# Patient Record
Sex: Female | Born: 1950 | Race: White | Hispanic: No | Smoking: Former smoker
Health system: Southern US, Community
[De-identification: ages and names within clinical notes are randomized; demographics above are authoritative.]

## PROBLEM LIST (undated history)

## (undated) DIAGNOSIS — G500 Trigeminal neuralgia: Secondary | ICD-10-CM

## (undated) DIAGNOSIS — K5792 Diverticulitis of intestine, part unspecified, without perforation or abscess without bleeding: Secondary | ICD-10-CM

## (undated) HISTORY — PX: CHOLECYSTECTOMY: SHX55

## (undated) HISTORY — PX: TONSILLECTOMY AND ADENOIDECTOMY: SUR1326

## (undated) HISTORY — PX: COLONOSCOPY W/ POLYPECTOMY: SHX1380

## (undated) HISTORY — DX: Trigeminal neuralgia: G50.0

## (undated) HISTORY — PX: NASAL SINUS SURGERY: SHX719

## (undated) HISTORY — PX: NASAL SEPTUM SURGERY: SHX37

## (undated) HISTORY — DX: Diverticulitis of intestine, part unspecified, without perforation or abscess without bleeding: K57.92

## (undated) HISTORY — PX: BUNIONECTOMY: SHX129

## (undated) HISTORY — PX: NEPHRECTOMY RADICAL: SUR878

---

## 1998-03-08 ENCOUNTER — Ambulatory Visit (HOSPITAL_COMMUNITY): Admission: RE | Admit: 1998-03-08 | Discharge: 1998-03-08 | Payer: Self-pay | Admitting: Gastroenterology

## 1998-12-15 ENCOUNTER — Other Ambulatory Visit: Admission: RE | Admit: 1998-12-15 | Discharge: 1998-12-15 | Payer: Self-pay | Admitting: Obstetrics and Gynecology

## 2015-04-20 DIAGNOSIS — R1011 Right upper quadrant pain: Secondary | ICD-10-CM | POA: Diagnosis not present

## 2015-04-26 DIAGNOSIS — R110 Nausea: Secondary | ICD-10-CM | POA: Diagnosis not present

## 2015-04-26 DIAGNOSIS — R1013 Epigastric pain: Secondary | ICD-10-CM | POA: Diagnosis not present

## 2015-04-26 DIAGNOSIS — K760 Fatty (change of) liver, not elsewhere classified: Secondary | ICD-10-CM | POA: Diagnosis not present

## 2015-08-22 DIAGNOSIS — I200 Unstable angina: Secondary | ICD-10-CM | POA: Diagnosis not present

## 2015-08-22 DIAGNOSIS — E7800 Pure hypercholesterolemia, unspecified: Secondary | ICD-10-CM | POA: Diagnosis not present

## 2015-08-22 DIAGNOSIS — Z716 Tobacco abuse counseling: Secondary | ICD-10-CM | POA: Diagnosis not present

## 2015-08-22 DIAGNOSIS — Z23 Encounter for immunization: Secondary | ICD-10-CM | POA: Diagnosis not present

## 2015-08-22 DIAGNOSIS — R079 Chest pain, unspecified: Secondary | ICD-10-CM | POA: Diagnosis not present

## 2015-08-22 DIAGNOSIS — I10 Essential (primary) hypertension: Secondary | ICD-10-CM | POA: Diagnosis not present

## 2015-08-22 DIAGNOSIS — R0602 Shortness of breath: Secondary | ICD-10-CM | POA: Diagnosis not present

## 2015-08-22 DIAGNOSIS — R0789 Other chest pain: Secondary | ICD-10-CM | POA: Diagnosis not present

## 2015-08-22 DIAGNOSIS — E785 Hyperlipidemia, unspecified: Secondary | ICD-10-CM | POA: Diagnosis not present

## 2015-08-22 DIAGNOSIS — F17210 Nicotine dependence, cigarettes, uncomplicated: Secondary | ICD-10-CM | POA: Diagnosis not present

## 2015-08-22 DIAGNOSIS — Z8249 Family history of ischemic heart disease and other diseases of the circulatory system: Secondary | ICD-10-CM | POA: Diagnosis not present

## 2015-08-22 DIAGNOSIS — Z720 Tobacco use: Secondary | ICD-10-CM | POA: Diagnosis not present

## 2015-08-23 DIAGNOSIS — I10 Essential (primary) hypertension: Secondary | ICD-10-CM | POA: Diagnosis not present

## 2015-08-23 DIAGNOSIS — R079 Chest pain, unspecified: Secondary | ICD-10-CM | POA: Diagnosis not present

## 2015-08-23 DIAGNOSIS — Z720 Tobacco use: Secondary | ICD-10-CM | POA: Diagnosis not present

## 2015-08-23 DIAGNOSIS — R0789 Other chest pain: Secondary | ICD-10-CM | POA: Diagnosis not present

## 2015-08-30 DIAGNOSIS — I10 Essential (primary) hypertension: Secondary | ICD-10-CM | POA: Diagnosis not present

## 2015-08-30 DIAGNOSIS — F4320 Adjustment disorder, unspecified: Secondary | ICD-10-CM | POA: Diagnosis not present

## 2015-12-22 DIAGNOSIS — J209 Acute bronchitis, unspecified: Secondary | ICD-10-CM | POA: Diagnosis not present

## 2016-05-08 DIAGNOSIS — F17200 Nicotine dependence, unspecified, uncomplicated: Secondary | ICD-10-CM | POA: Diagnosis not present

## 2016-05-08 DIAGNOSIS — K2970 Gastritis, unspecified, without bleeding: Secondary | ICD-10-CM | POA: Diagnosis not present

## 2016-05-08 DIAGNOSIS — D539 Nutritional anemia, unspecified: Secondary | ICD-10-CM | POA: Diagnosis not present

## 2016-07-17 DIAGNOSIS — H52223 Regular astigmatism, bilateral: Secondary | ICD-10-CM | POA: Diagnosis not present

## 2016-07-17 DIAGNOSIS — H43813 Vitreous degeneration, bilateral: Secondary | ICD-10-CM | POA: Diagnosis not present

## 2016-07-17 DIAGNOSIS — H5203 Hypermetropia, bilateral: Secondary | ICD-10-CM | POA: Diagnosis not present

## 2016-07-17 DIAGNOSIS — H43393 Other vitreous opacities, bilateral: Secondary | ICD-10-CM | POA: Diagnosis not present

## 2016-07-17 DIAGNOSIS — H524 Presbyopia: Secondary | ICD-10-CM | POA: Diagnosis not present

## 2016-07-17 DIAGNOSIS — I10 Essential (primary) hypertension: Secondary | ICD-10-CM | POA: Diagnosis not present

## 2016-08-02 DIAGNOSIS — I10 Essential (primary) hypertension: Secondary | ICD-10-CM | POA: Diagnosis not present

## 2016-08-02 DIAGNOSIS — Z23 Encounter for immunization: Secondary | ICD-10-CM | POA: Diagnosis not present

## 2016-08-02 DIAGNOSIS — R51 Headache: Secondary | ICD-10-CM | POA: Diagnosis not present

## 2016-08-02 DIAGNOSIS — H81399 Other peripheral vertigo, unspecified ear: Secondary | ICD-10-CM | POA: Diagnosis not present

## 2017-01-22 DIAGNOSIS — R112 Nausea with vomiting, unspecified: Secondary | ICD-10-CM | POA: Diagnosis not present

## 2017-01-22 DIAGNOSIS — I10 Essential (primary) hypertension: Secondary | ICD-10-CM | POA: Diagnosis not present

## 2017-01-22 DIAGNOSIS — R51 Headache: Secondary | ICD-10-CM | POA: Diagnosis not present

## 2017-01-22 DIAGNOSIS — R42 Dizziness and giddiness: Secondary | ICD-10-CM | POA: Diagnosis not present

## 2017-02-10 DIAGNOSIS — M791 Myalgia: Secondary | ICD-10-CM | POA: Diagnosis not present

## 2017-02-10 DIAGNOSIS — M549 Dorsalgia, unspecified: Secondary | ICD-10-CM | POA: Diagnosis not present

## 2017-02-10 DIAGNOSIS — M542 Cervicalgia: Secondary | ICD-10-CM | POA: Diagnosis not present

## 2017-02-11 DIAGNOSIS — M542 Cervicalgia: Secondary | ICD-10-CM | POA: Diagnosis not present

## 2017-02-19 DIAGNOSIS — F17210 Nicotine dependence, cigarettes, uncomplicated: Secondary | ICD-10-CM | POA: Diagnosis not present

## 2017-02-19 DIAGNOSIS — D519 Vitamin B12 deficiency anemia, unspecified: Secondary | ICD-10-CM | POA: Diagnosis not present

## 2017-02-19 DIAGNOSIS — Z79899 Other long term (current) drug therapy: Secondary | ICD-10-CM | POA: Diagnosis not present

## 2017-02-19 DIAGNOSIS — I10 Essential (primary) hypertension: Secondary | ICD-10-CM | POA: Diagnosis not present

## 2017-02-19 DIAGNOSIS — R5383 Other fatigue: Secondary | ICD-10-CM | POA: Diagnosis not present

## 2017-02-19 DIAGNOSIS — Z13220 Encounter for screening for lipoid disorders: Secondary | ICD-10-CM | POA: Diagnosis not present

## 2017-02-19 DIAGNOSIS — Z131 Encounter for screening for diabetes mellitus: Secondary | ICD-10-CM | POA: Diagnosis not present

## 2017-02-19 DIAGNOSIS — Z6835 Body mass index (BMI) 35.0-35.9, adult: Secondary | ICD-10-CM | POA: Diagnosis not present

## 2017-02-19 DIAGNOSIS — E559 Vitamin D deficiency, unspecified: Secondary | ICD-10-CM | POA: Diagnosis not present

## 2017-03-06 DIAGNOSIS — E782 Mixed hyperlipidemia: Secondary | ICD-10-CM | POA: Diagnosis not present

## 2017-03-06 DIAGNOSIS — Z79899 Other long term (current) drug therapy: Secondary | ICD-10-CM | POA: Diagnosis not present

## 2017-03-30 DIAGNOSIS — S81831A Puncture wound without foreign body, right lower leg, initial encounter: Secondary | ICD-10-CM | POA: Diagnosis not present

## 2017-03-30 DIAGNOSIS — R062 Wheezing: Secondary | ICD-10-CM | POA: Diagnosis not present

## 2017-04-02 DIAGNOSIS — S134XXA Sprain of ligaments of cervical spine, initial encounter: Secondary | ICD-10-CM | POA: Diagnosis not present

## 2017-04-02 DIAGNOSIS — R05 Cough: Secondary | ICD-10-CM | POA: Diagnosis not present

## 2017-04-02 DIAGNOSIS — J40 Bronchitis, not specified as acute or chronic: Secondary | ICD-10-CM | POA: Diagnosis not present

## 2017-04-30 DIAGNOSIS — Z87828 Personal history of other (healed) physical injury and trauma: Secondary | ICD-10-CM | POA: Diagnosis not present

## 2017-04-30 DIAGNOSIS — M62830 Muscle spasm of back: Secondary | ICD-10-CM | POA: Diagnosis not present

## 2017-05-20 DIAGNOSIS — R079 Chest pain, unspecified: Secondary | ICD-10-CM | POA: Diagnosis not present

## 2017-05-20 DIAGNOSIS — R0602 Shortness of breath: Secondary | ICD-10-CM | POA: Diagnosis not present

## 2017-05-20 DIAGNOSIS — D519 Vitamin B12 deficiency anemia, unspecified: Secondary | ICD-10-CM | POA: Diagnosis not present

## 2017-05-20 DIAGNOSIS — E782 Mixed hyperlipidemia: Secondary | ICD-10-CM | POA: Diagnosis not present

## 2017-05-20 DIAGNOSIS — I10 Essential (primary) hypertension: Secondary | ICD-10-CM | POA: Diagnosis not present

## 2017-05-20 DIAGNOSIS — E559 Vitamin D deficiency, unspecified: Secondary | ICD-10-CM | POA: Diagnosis not present

## 2017-05-20 DIAGNOSIS — Z79899 Other long term (current) drug therapy: Secondary | ICD-10-CM | POA: Diagnosis not present

## 2017-05-28 DIAGNOSIS — M62830 Muscle spasm of back: Secondary | ICD-10-CM | POA: Diagnosis not present

## 2017-05-28 DIAGNOSIS — Z87828 Personal history of other (healed) physical injury and trauma: Secondary | ICD-10-CM | POA: Diagnosis not present

## 2017-07-22 DIAGNOSIS — M5412 Radiculopathy, cervical region: Secondary | ICD-10-CM | POA: Diagnosis not present

## 2017-07-22 DIAGNOSIS — M542 Cervicalgia: Secondary | ICD-10-CM | POA: Diagnosis not present

## 2017-07-22 DIAGNOSIS — M545 Low back pain: Secondary | ICD-10-CM | POA: Diagnosis not present

## 2017-07-23 DIAGNOSIS — Z23 Encounter for immunization: Secondary | ICD-10-CM | POA: Diagnosis not present

## 2017-08-22 DIAGNOSIS — M542 Cervicalgia: Secondary | ICD-10-CM | POA: Diagnosis not present

## 2017-08-27 DIAGNOSIS — E559 Vitamin D deficiency, unspecified: Secondary | ICD-10-CM | POA: Diagnosis not present

## 2017-09-02 DIAGNOSIS — M542 Cervicalgia: Secondary | ICD-10-CM | POA: Diagnosis not present

## 2017-09-02 DIAGNOSIS — M545 Low back pain: Secondary | ICD-10-CM | POA: Diagnosis not present

## 2017-09-03 DIAGNOSIS — M47812 Spondylosis without myelopathy or radiculopathy, cervical region: Secondary | ICD-10-CM | POA: Diagnosis not present

## 2017-09-19 DIAGNOSIS — M47812 Spondylosis without myelopathy or radiculopathy, cervical region: Secondary | ICD-10-CM | POA: Diagnosis not present

## 2017-11-20 DIAGNOSIS — F321 Major depressive disorder, single episode, moderate: Secondary | ICD-10-CM | POA: Diagnosis not present

## 2017-11-20 DIAGNOSIS — E782 Mixed hyperlipidemia: Secondary | ICD-10-CM | POA: Diagnosis not present

## 2017-11-20 DIAGNOSIS — I10 Essential (primary) hypertension: Secondary | ICD-10-CM | POA: Diagnosis not present

## 2017-11-20 DIAGNOSIS — Z79899 Other long term (current) drug therapy: Secondary | ICD-10-CM | POA: Diagnosis not present

## 2017-11-20 DIAGNOSIS — E559 Vitamin D deficiency, unspecified: Secondary | ICD-10-CM | POA: Diagnosis not present

## 2017-11-20 DIAGNOSIS — R0602 Shortness of breath: Secondary | ICD-10-CM | POA: Diagnosis not present

## 2017-11-20 DIAGNOSIS — N3281 Overactive bladder: Secondary | ICD-10-CM | POA: Diagnosis not present

## 2017-12-24 DIAGNOSIS — M47812 Spondylosis without myelopathy or radiculopathy, cervical region: Secondary | ICD-10-CM | POA: Diagnosis not present

## 2018-01-01 DIAGNOSIS — F321 Major depressive disorder, single episode, moderate: Secondary | ICD-10-CM | POA: Diagnosis not present

## 2018-01-01 DIAGNOSIS — R5383 Other fatigue: Secondary | ICD-10-CM | POA: Diagnosis not present

## 2018-01-06 DIAGNOSIS — M791 Myalgia, unspecified site: Secondary | ICD-10-CM | POA: Diagnosis not present

## 2018-01-06 DIAGNOSIS — M542 Cervicalgia: Secondary | ICD-10-CM | POA: Diagnosis not present

## 2018-01-06 DIAGNOSIS — M545 Low back pain: Secondary | ICD-10-CM | POA: Diagnosis not present

## 2018-01-13 DIAGNOSIS — M791 Myalgia, unspecified site: Secondary | ICD-10-CM | POA: Diagnosis not present

## 2018-01-13 DIAGNOSIS — M542 Cervicalgia: Secondary | ICD-10-CM | POA: Diagnosis not present

## 2018-01-13 DIAGNOSIS — M545 Low back pain: Secondary | ICD-10-CM | POA: Diagnosis not present

## 2018-01-15 DIAGNOSIS — M542 Cervicalgia: Secondary | ICD-10-CM | POA: Diagnosis not present

## 2018-01-15 DIAGNOSIS — M791 Myalgia, unspecified site: Secondary | ICD-10-CM | POA: Diagnosis not present

## 2018-01-15 DIAGNOSIS — M545 Low back pain: Secondary | ICD-10-CM | POA: Diagnosis not present

## 2018-01-27 DIAGNOSIS — M542 Cervicalgia: Secondary | ICD-10-CM | POA: Diagnosis not present

## 2018-01-27 DIAGNOSIS — M545 Low back pain: Secondary | ICD-10-CM | POA: Diagnosis not present

## 2018-01-27 DIAGNOSIS — M791 Myalgia, unspecified site: Secondary | ICD-10-CM | POA: Diagnosis not present

## 2018-01-30 DIAGNOSIS — M791 Myalgia, unspecified site: Secondary | ICD-10-CM | POA: Diagnosis not present

## 2018-01-30 DIAGNOSIS — M542 Cervicalgia: Secondary | ICD-10-CM | POA: Diagnosis not present

## 2018-01-30 DIAGNOSIS — M545 Low back pain: Secondary | ICD-10-CM | POA: Diagnosis not present

## 2018-02-05 DIAGNOSIS — N2889 Other specified disorders of kidney and ureter: Secondary | ICD-10-CM | POA: Diagnosis not present

## 2018-02-05 DIAGNOSIS — R935 Abnormal findings on diagnostic imaging of other abdominal regions, including retroperitoneum: Secondary | ICD-10-CM | POA: Diagnosis not present

## 2018-02-05 DIAGNOSIS — I700 Atherosclerosis of aorta: Secondary | ICD-10-CM | POA: Diagnosis not present

## 2018-02-05 DIAGNOSIS — K5792 Diverticulitis of intestine, part unspecified, without perforation or abscess without bleeding: Secondary | ICD-10-CM | POA: Diagnosis not present

## 2018-02-05 DIAGNOSIS — K5732 Diverticulitis of large intestine without perforation or abscess without bleeding: Secondary | ICD-10-CM | POA: Diagnosis not present

## 2018-02-05 DIAGNOSIS — R1030 Lower abdominal pain, unspecified: Secondary | ICD-10-CM | POA: Diagnosis not present

## 2018-02-10 DIAGNOSIS — K5792 Diverticulitis of intestine, part unspecified, without perforation or abscess without bleeding: Secondary | ICD-10-CM | POA: Diagnosis not present

## 2018-02-19 DIAGNOSIS — N2889 Other specified disorders of kidney and ureter: Secondary | ICD-10-CM | POA: Diagnosis not present

## 2018-03-09 DIAGNOSIS — H6123 Impacted cerumen, bilateral: Secondary | ICD-10-CM | POA: Diagnosis not present

## 2018-03-09 DIAGNOSIS — J069 Acute upper respiratory infection, unspecified: Secondary | ICD-10-CM | POA: Diagnosis not present

## 2018-03-13 DIAGNOSIS — R062 Wheezing: Secondary | ICD-10-CM | POA: Diagnosis not present

## 2018-03-13 DIAGNOSIS — J189 Pneumonia, unspecified organism: Secondary | ICD-10-CM | POA: Diagnosis not present

## 2018-03-13 DIAGNOSIS — R05 Cough: Secondary | ICD-10-CM | POA: Diagnosis not present

## 2018-03-13 DIAGNOSIS — R0602 Shortness of breath: Secondary | ICD-10-CM | POA: Diagnosis not present

## 2018-03-13 DIAGNOSIS — S134XXA Sprain of ligaments of cervical spine, initial encounter: Secondary | ICD-10-CM | POA: Diagnosis not present

## 2018-04-02 DIAGNOSIS — N2889 Other specified disorders of kidney and ureter: Secondary | ICD-10-CM | POA: Diagnosis not present

## 2018-04-02 DIAGNOSIS — Z87891 Personal history of nicotine dependence: Secondary | ICD-10-CM | POA: Diagnosis not present

## 2018-04-02 DIAGNOSIS — R918 Other nonspecific abnormal finding of lung field: Secondary | ICD-10-CM | POA: Diagnosis not present

## 2018-04-28 DIAGNOSIS — N2889 Other specified disorders of kidney and ureter: Secondary | ICD-10-CM | POA: Diagnosis not present

## 2018-05-15 DIAGNOSIS — E119 Type 2 diabetes mellitus without complications: Secondary | ICD-10-CM | POA: Diagnosis present

## 2018-05-15 DIAGNOSIS — E669 Obesity, unspecified: Secondary | ICD-10-CM | POA: Diagnosis present

## 2018-05-15 DIAGNOSIS — M899 Disorder of bone, unspecified: Secondary | ICD-10-CM | POA: Diagnosis present

## 2018-05-15 DIAGNOSIS — N281 Cyst of kidney, acquired: Secondary | ICD-10-CM | POA: Diagnosis present

## 2018-05-15 DIAGNOSIS — E785 Hyperlipidemia, unspecified: Secondary | ICD-10-CM | POA: Diagnosis present

## 2018-05-15 DIAGNOSIS — C641 Malignant neoplasm of right kidney, except renal pelvis: Secondary | ICD-10-CM | POA: Diagnosis not present

## 2018-05-15 DIAGNOSIS — I708 Atherosclerosis of other arteries: Secondary | ICD-10-CM | POA: Diagnosis present

## 2018-05-15 DIAGNOSIS — I10 Essential (primary) hypertension: Secondary | ICD-10-CM | POA: Diagnosis present

## 2018-05-15 DIAGNOSIS — Z6835 Body mass index (BMI) 35.0-35.9, adult: Secondary | ICD-10-CM | POA: Diagnosis not present

## 2018-05-15 DIAGNOSIS — R109 Unspecified abdominal pain: Secondary | ICD-10-CM | POA: Diagnosis not present

## 2018-05-15 DIAGNOSIS — N2889 Other specified disorders of kidney and ureter: Secondary | ICD-10-CM | POA: Diagnosis not present

## 2018-05-15 DIAGNOSIS — J449 Chronic obstructive pulmonary disease, unspecified: Secondary | ICD-10-CM | POA: Diagnosis not present

## 2018-05-15 DIAGNOSIS — F17210 Nicotine dependence, cigarettes, uncomplicated: Secondary | ICD-10-CM | POA: Diagnosis present

## 2018-05-15 DIAGNOSIS — K5730 Diverticulosis of large intestine without perforation or abscess without bleeding: Secondary | ICD-10-CM | POA: Diagnosis present

## 2018-06-05 DIAGNOSIS — R490 Dysphonia: Secondary | ICD-10-CM | POA: Diagnosis not present

## 2018-06-05 DIAGNOSIS — F17200 Nicotine dependence, unspecified, uncomplicated: Secondary | ICD-10-CM | POA: Diagnosis not present

## 2018-06-05 DIAGNOSIS — J343 Hypertrophy of nasal turbinates: Secondary | ICD-10-CM | POA: Diagnosis not present

## 2018-06-05 DIAGNOSIS — Z905 Acquired absence of kidney: Secondary | ICD-10-CM | POA: Diagnosis not present

## 2018-06-05 DIAGNOSIS — J3489 Other specified disorders of nose and nasal sinuses: Secondary | ICD-10-CM | POA: Diagnosis not present

## 2018-06-05 DIAGNOSIS — R0981 Nasal congestion: Secondary | ICD-10-CM | POA: Diagnosis not present

## 2018-06-05 DIAGNOSIS — T8579XA Infection and inflammatory reaction due to other internal prosthetic devices, implants and grafts, initial encounter: Secondary | ICD-10-CM | POA: Diagnosis not present

## 2018-06-05 DIAGNOSIS — C641 Malignant neoplasm of right kidney, except renal pelvis: Secondary | ICD-10-CM | POA: Diagnosis not present

## 2018-06-05 DIAGNOSIS — Z9889 Other specified postprocedural states: Secondary | ICD-10-CM | POA: Diagnosis not present

## 2018-06-05 DIAGNOSIS — J342 Deviated nasal septum: Secondary | ICD-10-CM | POA: Diagnosis not present

## 2018-06-18 DIAGNOSIS — C649 Malignant neoplasm of unspecified kidney, except renal pelvis: Secondary | ICD-10-CM | POA: Diagnosis not present

## 2018-08-04 DIAGNOSIS — Z23 Encounter for immunization: Secondary | ICD-10-CM | POA: Diagnosis not present

## 2018-09-08 DIAGNOSIS — N2889 Other specified disorders of kidney and ureter: Secondary | ICD-10-CM | POA: Diagnosis not present

## 2018-09-08 DIAGNOSIS — R0602 Shortness of breath: Secondary | ICD-10-CM | POA: Diagnosis not present

## 2018-09-08 DIAGNOSIS — N6459 Other signs and symptoms in breast: Secondary | ICD-10-CM | POA: Diagnosis not present

## 2018-09-08 DIAGNOSIS — I10 Essential (primary) hypertension: Secondary | ICD-10-CM | POA: Diagnosis not present

## 2018-09-08 DIAGNOSIS — Z6834 Body mass index (BMI) 34.0-34.9, adult: Secondary | ICD-10-CM | POA: Diagnosis not present

## 2018-09-08 DIAGNOSIS — K5792 Diverticulitis of intestine, part unspecified, without perforation or abscess without bleeding: Secondary | ICD-10-CM | POA: Diagnosis not present

## 2018-09-18 DIAGNOSIS — D72829 Elevated white blood cell count, unspecified: Secondary | ICD-10-CM | POA: Diagnosis not present

## 2018-10-02 DIAGNOSIS — K219 Gastro-esophageal reflux disease without esophagitis: Secondary | ICD-10-CM | POA: Diagnosis not present

## 2018-10-02 DIAGNOSIS — R1013 Epigastric pain: Secondary | ICD-10-CM | POA: Diagnosis not present

## 2018-10-02 DIAGNOSIS — R1011 Right upper quadrant pain: Secondary | ICD-10-CM | POA: Diagnosis not present

## 2018-10-17 DIAGNOSIS — I10 Essential (primary) hypertension: Secondary | ICD-10-CM | POA: Diagnosis not present

## 2018-10-17 DIAGNOSIS — K5730 Diverticulosis of large intestine without perforation or abscess without bleeding: Secondary | ICD-10-CM | POA: Diagnosis not present

## 2018-10-17 DIAGNOSIS — F17210 Nicotine dependence, cigarettes, uncomplicated: Secondary | ICD-10-CM | POA: Diagnosis not present

## 2018-10-17 DIAGNOSIS — D126 Benign neoplasm of colon, unspecified: Secondary | ICD-10-CM | POA: Diagnosis not present

## 2018-10-17 DIAGNOSIS — D123 Benign neoplasm of transverse colon: Secondary | ICD-10-CM | POA: Diagnosis not present

## 2018-10-17 DIAGNOSIS — D649 Anemia, unspecified: Secondary | ICD-10-CM | POA: Diagnosis not present

## 2018-10-17 DIAGNOSIS — Z8619 Personal history of other infectious and parasitic diseases: Secondary | ICD-10-CM | POA: Diagnosis not present

## 2018-10-17 DIAGNOSIS — Z85528 Personal history of other malignant neoplasm of kidney: Secondary | ICD-10-CM | POA: Diagnosis not present

## 2018-10-17 DIAGNOSIS — R109 Unspecified abdominal pain: Secondary | ICD-10-CM | POA: Diagnosis not present

## 2018-10-17 DIAGNOSIS — D120 Benign neoplasm of cecum: Secondary | ICD-10-CM | POA: Diagnosis not present

## 2018-10-17 DIAGNOSIS — J45909 Unspecified asthma, uncomplicated: Secondary | ICD-10-CM | POA: Diagnosis not present

## 2018-10-17 DIAGNOSIS — B9681 Helicobacter pylori [H. pylori] as the cause of diseases classified elsewhere: Secondary | ICD-10-CM | POA: Diagnosis not present

## 2018-10-17 DIAGNOSIS — D128 Benign neoplasm of rectum: Secondary | ICD-10-CM | POA: Diagnosis not present

## 2018-10-17 DIAGNOSIS — Z800 Family history of malignant neoplasm of digestive organs: Secondary | ICD-10-CM | POA: Diagnosis not present

## 2018-10-17 DIAGNOSIS — K621 Rectal polyp: Secondary | ICD-10-CM | POA: Diagnosis not present

## 2018-10-17 DIAGNOSIS — Z79899 Other long term (current) drug therapy: Secondary | ICD-10-CM | POA: Diagnosis not present

## 2018-10-17 DIAGNOSIS — Z1211 Encounter for screening for malignant neoplasm of colon: Secondary | ICD-10-CM | POA: Diagnosis not present

## 2018-10-17 DIAGNOSIS — D124 Benign neoplasm of descending colon: Secondary | ICD-10-CM | POA: Diagnosis not present

## 2018-10-17 DIAGNOSIS — K219 Gastro-esophageal reflux disease without esophagitis: Secondary | ICD-10-CM | POA: Diagnosis not present

## 2018-10-22 DIAGNOSIS — N6489 Other specified disorders of breast: Secondary | ICD-10-CM | POA: Diagnosis not present

## 2018-10-22 DIAGNOSIS — R928 Other abnormal and inconclusive findings on diagnostic imaging of breast: Secondary | ICD-10-CM | POA: Diagnosis not present

## 2018-10-22 DIAGNOSIS — N6459 Other signs and symptoms in breast: Secondary | ICD-10-CM | POA: Diagnosis not present

## 2018-12-02 DIAGNOSIS — B370 Candidal stomatitis: Secondary | ICD-10-CM | POA: Diagnosis not present

## 2018-12-02 DIAGNOSIS — J40 Bronchitis, not specified as acute or chronic: Secondary | ICD-10-CM | POA: Diagnosis not present

## 2018-12-02 DIAGNOSIS — Z6835 Body mass index (BMI) 35.0-35.9, adult: Secondary | ICD-10-CM | POA: Diagnosis not present

## 2018-12-02 DIAGNOSIS — E669 Obesity, unspecified: Secondary | ICD-10-CM | POA: Diagnosis not present

## 2018-12-17 DIAGNOSIS — N281 Cyst of kidney, acquired: Secondary | ICD-10-CM | POA: Diagnosis not present

## 2018-12-17 DIAGNOSIS — C641 Malignant neoplasm of right kidney, except renal pelvis: Secondary | ICD-10-CM | POA: Diagnosis not present

## 2018-12-17 DIAGNOSIS — C649 Malignant neoplasm of unspecified kidney, except renal pelvis: Secondary | ICD-10-CM | POA: Diagnosis not present

## 2018-12-19 ENCOUNTER — Other Ambulatory Visit: Payer: Self-pay | Admitting: Urology

## 2018-12-19 DIAGNOSIS — K828 Other specified diseases of gallbladder: Secondary | ICD-10-CM

## 2018-12-19 DIAGNOSIS — C641 Malignant neoplasm of right kidney, except renal pelvis: Secondary | ICD-10-CM

## 2018-12-24 ENCOUNTER — Inpatient Hospital Stay: Admission: RE | Admit: 2018-12-24 | Payer: Self-pay | Source: Ambulatory Visit

## 2018-12-25 ENCOUNTER — Ambulatory Visit
Admission: RE | Admit: 2018-12-25 | Discharge: 2018-12-25 | Disposition: A | Discharge status: Home / Self Care | Payer: Medicare Other | Source: Ambulatory Visit | Attending: Urology | Admitting: Urology

## 2018-12-25 ENCOUNTER — Ambulatory Visit
Admission: RE | Admit: 2018-12-25 | Discharge: 2018-12-25 | Disposition: A | Discharge status: Home / Self Care | Payer: Self-pay | Source: Ambulatory Visit | Attending: Urology | Admitting: Urology

## 2018-12-25 ENCOUNTER — Other Ambulatory Visit: Payer: Self-pay | Admitting: Urology

## 2018-12-25 DIAGNOSIS — K828 Other specified diseases of gallbladder: Secondary | ICD-10-CM

## 2018-12-25 DIAGNOSIS — C641 Malignant neoplasm of right kidney, except renal pelvis: Secondary | ICD-10-CM

## 2019-01-29 DIAGNOSIS — K8010 Calculus of gallbladder with chronic cholecystitis without obstruction: Secondary | ICD-10-CM

## 2019-01-29 HISTORY — DX: Calculus of gallbladder with chronic cholecystitis without obstruction: K80.10

## 2019-02-04 DIAGNOSIS — N189 Chronic kidney disease, unspecified: Secondary | ICD-10-CM | POA: Diagnosis not present

## 2019-02-04 DIAGNOSIS — F17210 Nicotine dependence, cigarettes, uncomplicated: Secondary | ICD-10-CM | POA: Diagnosis not present

## 2019-02-04 DIAGNOSIS — K8010 Calculus of gallbladder with chronic cholecystitis without obstruction: Secondary | ICD-10-CM | POA: Diagnosis not present

## 2019-02-04 DIAGNOSIS — Z01818 Encounter for other preprocedural examination: Secondary | ICD-10-CM | POA: Diagnosis not present

## 2019-02-04 DIAGNOSIS — R160 Hepatomegaly, not elsewhere classified: Secondary | ICD-10-CM | POA: Diagnosis not present

## 2019-02-04 DIAGNOSIS — K660 Peritoneal adhesions (postprocedural) (postinfection): Secondary | ICD-10-CM | POA: Diagnosis not present

## 2019-02-04 DIAGNOSIS — K829 Disease of gallbladder, unspecified: Secondary | ICD-10-CM | POA: Diagnosis not present

## 2019-02-04 DIAGNOSIS — Z8719 Personal history of other diseases of the digestive system: Secondary | ICD-10-CM | POA: Diagnosis not present

## 2019-02-04 DIAGNOSIS — Z79899 Other long term (current) drug therapy: Secondary | ICD-10-CM | POA: Diagnosis not present

## 2019-02-04 DIAGNOSIS — K8020 Calculus of gallbladder without cholecystitis without obstruction: Secondary | ICD-10-CM | POA: Diagnosis not present

## 2019-02-05 DIAGNOSIS — K829 Disease of gallbladder, unspecified: Secondary | ICD-10-CM | POA: Diagnosis not present

## 2019-02-05 DIAGNOSIS — N189 Chronic kidney disease, unspecified: Secondary | ICD-10-CM | POA: Diagnosis not present

## 2019-02-05 DIAGNOSIS — Z8719 Personal history of other diseases of the digestive system: Secondary | ICD-10-CM | POA: Diagnosis not present

## 2019-02-05 DIAGNOSIS — K660 Peritoneal adhesions (postprocedural) (postinfection): Secondary | ICD-10-CM | POA: Diagnosis not present

## 2019-02-05 DIAGNOSIS — K8010 Calculus of gallbladder with chronic cholecystitis without obstruction: Secondary | ICD-10-CM | POA: Diagnosis not present

## 2019-02-05 DIAGNOSIS — F17210 Nicotine dependence, cigarettes, uncomplicated: Secondary | ICD-10-CM | POA: Diagnosis not present

## 2019-02-19 DIAGNOSIS — Z09 Encounter for follow-up examination after completed treatment for conditions other than malignant neoplasm: Secondary | ICD-10-CM | POA: Insufficient documentation

## 2019-03-16 DIAGNOSIS — K5730 Diverticulosis of large intestine without perforation or abscess without bleeding: Secondary | ICD-10-CM | POA: Diagnosis not present

## 2019-03-16 DIAGNOSIS — D126 Benign neoplasm of colon, unspecified: Secondary | ICD-10-CM | POA: Diagnosis not present

## 2019-03-30 DIAGNOSIS — Z6835 Body mass index (BMI) 35.0-35.9, adult: Secondary | ICD-10-CM | POA: Diagnosis not present

## 2019-03-30 DIAGNOSIS — Z79899 Other long term (current) drug therapy: Secondary | ICD-10-CM | POA: Diagnosis not present

## 2019-03-30 DIAGNOSIS — K5792 Diverticulitis of intestine, part unspecified, without perforation or abscess without bleeding: Secondary | ICD-10-CM | POA: Diagnosis not present

## 2019-04-03 DIAGNOSIS — D72829 Elevated white blood cell count, unspecified: Secondary | ICD-10-CM | POA: Diagnosis not present

## 2019-04-06 DIAGNOSIS — Z1331 Encounter for screening for depression: Secondary | ICD-10-CM | POA: Diagnosis not present

## 2019-04-06 DIAGNOSIS — R062 Wheezing: Secondary | ICD-10-CM | POA: Diagnosis not present

## 2019-04-06 DIAGNOSIS — Z0001 Encounter for general adult medical examination with abnormal findings: Secondary | ICD-10-CM | POA: Diagnosis not present

## 2019-04-06 DIAGNOSIS — Z6835 Body mass index (BMI) 35.0-35.9, adult: Secondary | ICD-10-CM | POA: Diagnosis not present

## 2019-04-06 DIAGNOSIS — E669 Obesity, unspecified: Secondary | ICD-10-CM | POA: Diagnosis not present

## 2019-04-06 DIAGNOSIS — Z122 Encounter for screening for malignant neoplasm of respiratory organs: Secondary | ICD-10-CM | POA: Diagnosis not present

## 2019-04-06 DIAGNOSIS — G4719 Other hypersomnia: Secondary | ICD-10-CM | POA: Diagnosis not present

## 2019-04-06 DIAGNOSIS — Z23 Encounter for immunization: Secondary | ICD-10-CM | POA: Diagnosis not present

## 2019-04-06 DIAGNOSIS — Z1339 Encounter for screening examination for other mental health and behavioral disorders: Secondary | ICD-10-CM | POA: Diagnosis not present

## 2019-04-06 DIAGNOSIS — F17210 Nicotine dependence, cigarettes, uncomplicated: Secondary | ICD-10-CM | POA: Diagnosis not present

## 2019-04-07 DIAGNOSIS — G4719 Other hypersomnia: Secondary | ICD-10-CM | POA: Diagnosis not present

## 2019-04-07 DIAGNOSIS — E162 Hypoglycemia, unspecified: Secondary | ICD-10-CM | POA: Diagnosis not present

## 2019-04-07 DIAGNOSIS — R5383 Other fatigue: Secondary | ICD-10-CM | POA: Diagnosis not present

## 2019-04-07 DIAGNOSIS — Z0001 Encounter for general adult medical examination with abnormal findings: Secondary | ICD-10-CM | POA: Diagnosis not present

## 2019-04-15 DIAGNOSIS — D126 Benign neoplasm of colon, unspecified: Secondary | ICD-10-CM | POA: Diagnosis not present

## 2019-04-15 DIAGNOSIS — K219 Gastro-esophageal reflux disease without esophagitis: Secondary | ICD-10-CM | POA: Diagnosis not present

## 2019-05-01 DIAGNOSIS — Z87891 Personal history of nicotine dependence: Secondary | ICD-10-CM | POA: Diagnosis not present

## 2019-05-01 DIAGNOSIS — F17210 Nicotine dependence, cigarettes, uncomplicated: Secondary | ICD-10-CM | POA: Diagnosis not present

## 2019-05-07 ENCOUNTER — Other Ambulatory Visit: Payer: Self-pay

## 2019-05-08 DIAGNOSIS — G4710 Hypersomnia, unspecified: Secondary | ICD-10-CM | POA: Diagnosis not present

## 2019-05-19 DIAGNOSIS — E161 Other hypoglycemia: Secondary | ICD-10-CM | POA: Diagnosis not present

## 2019-05-21 DIAGNOSIS — E8881 Metabolic syndrome: Secondary | ICD-10-CM | POA: Diagnosis not present

## 2019-05-21 DIAGNOSIS — Z6835 Body mass index (BMI) 35.0-35.9, adult: Secondary | ICD-10-CM | POA: Diagnosis not present

## 2019-05-21 DIAGNOSIS — E669 Obesity, unspecified: Secondary | ICD-10-CM | POA: Diagnosis not present

## 2019-05-27 DIAGNOSIS — K591 Functional diarrhea: Secondary | ICD-10-CM | POA: Diagnosis not present

## 2019-05-27 DIAGNOSIS — K219 Gastro-esophageal reflux disease without esophagitis: Secondary | ICD-10-CM | POA: Diagnosis not present

## 2019-05-27 DIAGNOSIS — R1011 Right upper quadrant pain: Secondary | ICD-10-CM | POA: Diagnosis not present

## 2019-06-02 ENCOUNTER — Other Ambulatory Visit: Payer: Self-pay

## 2019-06-02 DIAGNOSIS — R001 Bradycardia, unspecified: Secondary | ICD-10-CM

## 2019-06-02 DIAGNOSIS — I495 Sick sinus syndrome: Secondary | ICD-10-CM

## 2019-06-08 ENCOUNTER — Other Ambulatory Visit (INDEPENDENT_AMBULATORY_CARE_PROVIDER_SITE_OTHER): Payer: Medicare Other

## 2019-06-08 DIAGNOSIS — R001 Bradycardia, unspecified: Secondary | ICD-10-CM | POA: Diagnosis not present

## 2019-06-16 DIAGNOSIS — R001 Bradycardia, unspecified: Secondary | ICD-10-CM | POA: Diagnosis not present

## 2019-06-19 DIAGNOSIS — C649 Malignant neoplasm of unspecified kidney, except renal pelvis: Secondary | ICD-10-CM | POA: Diagnosis not present

## 2019-06-19 DIAGNOSIS — Z905 Acquired absence of kidney: Secondary | ICD-10-CM | POA: Diagnosis not present

## 2019-06-19 DIAGNOSIS — N281 Cyst of kidney, acquired: Secondary | ICD-10-CM | POA: Diagnosis not present

## 2019-06-19 DIAGNOSIS — C641 Malignant neoplasm of right kidney, except renal pelvis: Secondary | ICD-10-CM | POA: Diagnosis not present

## 2019-06-19 DIAGNOSIS — K760 Fatty (change of) liver, not elsewhere classified: Secondary | ICD-10-CM | POA: Diagnosis not present

## 2019-06-19 DIAGNOSIS — F17210 Nicotine dependence, cigarettes, uncomplicated: Secondary | ICD-10-CM | POA: Diagnosis not present

## 2019-08-10 DIAGNOSIS — J40 Bronchitis, not specified as acute or chronic: Secondary | ICD-10-CM | POA: Diagnosis not present

## 2019-08-10 DIAGNOSIS — Z6836 Body mass index (BMI) 36.0-36.9, adult: Secondary | ICD-10-CM | POA: Diagnosis not present

## 2019-08-21 DIAGNOSIS — Z23 Encounter for immunization: Secondary | ICD-10-CM | POA: Diagnosis not present

## 2019-09-08 ENCOUNTER — Other Ambulatory Visit: Payer: Self-pay

## 2019-09-17 ENCOUNTER — Other Ambulatory Visit: Payer: Self-pay | Admitting: *Deleted

## 2019-09-17 NOTE — Patient Outreach (Signed)
[facility]) Care Management  09/17/2019  Holly Hardy March 27, 1951 [id number]   Telephone Screen  Referral Date:  09/08/2019 Referral Source:  EMMI Prevent Reason for Referral:  Screening Insurance:  Medicare   Outreach Attempt: Successful telephone outreach to patient for telephone screening.  HIPAA verified with patient.  Patient completed telephone screening.  Social:  Patient lives at home alone.  Reports being independent with ADLs and IADLs.  Ambulates independently but reports about 4 falls in the past year without injuries.  Does acknowledge gait is a little unsteady and discussed and encouraged patient use cane or walker to help with balance to prevent falls.  Fall precautions and preventions reviewed and discussed.  Reports she drives herself to her medical appointments and when she feels she is unable to drive she would ask a friend to drive her.  DME in the home include:  Portable pulse oximetry, quad cane, straight cane, partial upper denture, nebulizer, blood pressure cuff, grab bar in shower , hospital bed, wheelchair, scale, eyeglasses, and electric scooter.  Conditions:  Per chart review and discussion with patient, PMH include but not limited to:  Renal cell carcinoma with partial nephrectomy (per patient), chronic kidney disease, diverticulitis, bronchitis, hypertension, asthma, and nasal surgery.  Reports she was recently diagnosed with insulin resistance and has been started on Metformin.  Denies being told she was a diabetic and states she does not monitor her blood sugars.  Denies any recent emergency room visits.  States she recently had a sleep study and was told she needs to wear oxygen at night time.  She is awaiting an appointment with pulmonology.  Medications:  Patient reports taking 3 medications a day.  States she manages medications herself but sometimes forgets to take them.  Discussed and encouraged patient to use pill box so she will be able  to tell whether or not she had taken her medication for the day or not.  Denies any issues with affording medications.  Appointments:  States she attended appointment with primary care provider around 08/19/2019.  Advanced Directives:  Reports having a Living Will and Healthcare Power of Attorney in place and does not wish to create one at this time.   Consent:  [facility] services reviewed and discussed.  Patient states she feels she is managing well at this time and declines [facility] services.  Agreeable to [facility] Pamphlet to be mailed.  Encouraged patient to contact [facility] in the future if needs arise.  Plan:  RN Health Coach will send patient Successful Letter with [facility] Pamphlet.  RN Health Coach will close case and make patient inactive with [facility] due to patient declining services at this time.   Oto [phone] [email or web address]

## 2019-10-12 DIAGNOSIS — R0683 Snoring: Secondary | ICD-10-CM | POA: Insufficient documentation

## 2019-10-12 DIAGNOSIS — J432 Centrilobular emphysema: Secondary | ICD-10-CM

## 2019-10-12 DIAGNOSIS — J449 Chronic obstructive pulmonary disease, unspecified: Secondary | ICD-10-CM | POA: Insufficient documentation

## 2019-10-12 DIAGNOSIS — R400 Somnolence: Secondary | ICD-10-CM

## 2019-10-12 DIAGNOSIS — R0609 Other forms of dyspnea: Secondary | ICD-10-CM

## 2019-10-12 DIAGNOSIS — F17210 Nicotine dependence, cigarettes, uncomplicated: Secondary | ICD-10-CM | POA: Insufficient documentation

## 2019-10-12 HISTORY — DX: Other forms of dyspnea: R06.09

## 2019-10-12 HISTORY — DX: Somnolence: R40.0

## 2019-10-12 HISTORY — DX: Chronic obstructive pulmonary disease, unspecified: J44.9

## 2019-10-12 HISTORY — DX: Snoring: R06.83

## 2019-10-12 HISTORY — DX: Centrilobular emphysema: J43.2

## 2019-10-20 DIAGNOSIS — J432 Centrilobular emphysema: Secondary | ICD-10-CM | POA: Diagnosis not present

## 2019-10-27 DIAGNOSIS — F17210 Nicotine dependence, cigarettes, uncomplicated: Secondary | ICD-10-CM | POA: Diagnosis not present

## 2019-10-27 DIAGNOSIS — J329 Chronic sinusitis, unspecified: Secondary | ICD-10-CM | POA: Diagnosis not present

## 2019-10-27 DIAGNOSIS — J449 Chronic obstructive pulmonary disease, unspecified: Secondary | ICD-10-CM | POA: Diagnosis not present

## 2019-10-27 DIAGNOSIS — R400 Somnolence: Secondary | ICD-10-CM | POA: Diagnosis not present

## 2019-12-08 DIAGNOSIS — K760 Fatty (change of) liver, not elsewhere classified: Secondary | ICD-10-CM | POA: Diagnosis not present

## 2019-12-08 DIAGNOSIS — C641 Malignant neoplasm of right kidney, except renal pelvis: Secondary | ICD-10-CM | POA: Diagnosis not present

## 2019-12-08 DIAGNOSIS — C649 Malignant neoplasm of unspecified kidney, except renal pelvis: Secondary | ICD-10-CM | POA: Diagnosis not present

## 2019-12-08 DIAGNOSIS — K668 Other specified disorders of peritoneum: Secondary | ICD-10-CM | POA: Diagnosis not present

## 2019-12-15 DIAGNOSIS — C641 Malignant neoplasm of right kidney, except renal pelvis: Secondary | ICD-10-CM | POA: Diagnosis not present

## 2020-02-09 DIAGNOSIS — H25813 Combined forms of age-related cataract, bilateral: Secondary | ICD-10-CM | POA: Diagnosis not present

## 2020-02-09 DIAGNOSIS — H3530 Unspecified macular degeneration: Secondary | ICD-10-CM | POA: Diagnosis not present

## 2020-02-09 DIAGNOSIS — H52223 Regular astigmatism, bilateral: Secondary | ICD-10-CM | POA: Diagnosis not present

## 2020-02-09 DIAGNOSIS — H353131 Nonexudative age-related macular degeneration, bilateral, early dry stage: Secondary | ICD-10-CM | POA: Diagnosis not present

## 2020-02-09 DIAGNOSIS — H5203 Hypermetropia, bilateral: Secondary | ICD-10-CM | POA: Diagnosis not present

## 2020-02-09 DIAGNOSIS — I10 Essential (primary) hypertension: Secondary | ICD-10-CM | POA: Diagnosis not present

## 2020-02-09 DIAGNOSIS — H524 Presbyopia: Secondary | ICD-10-CM | POA: Diagnosis not present

## 2020-02-27 IMAGING — US ULTRASOUND ABDOMEN LIMITED
1 series · 14 of 25 positions shown · non-contrast
Comparison: None.

CLINICAL DATA: Possible gallstones on recent CT

EXAM:
ULTRASOUND ABDOMEN LIMITED RIGHT UPPER QUADRANT

[Series 1: ultrasound abdomen limited · 0.15mm/px · 14 of 52 slices shown]
[im 1/52]
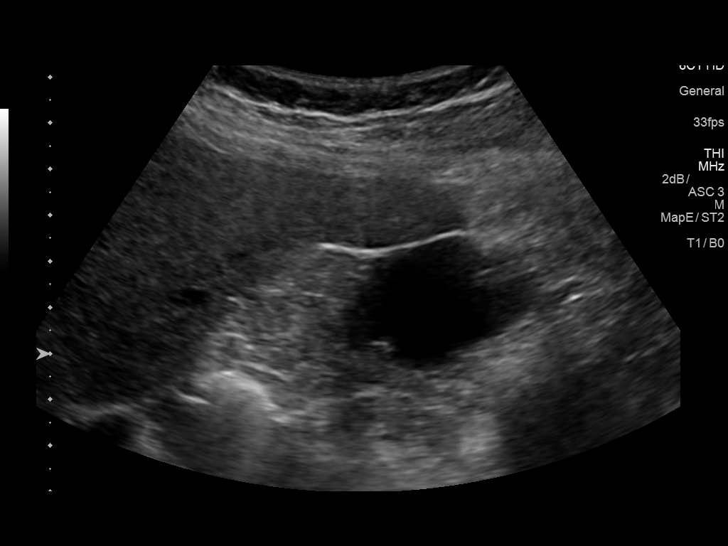
[im 5/52]
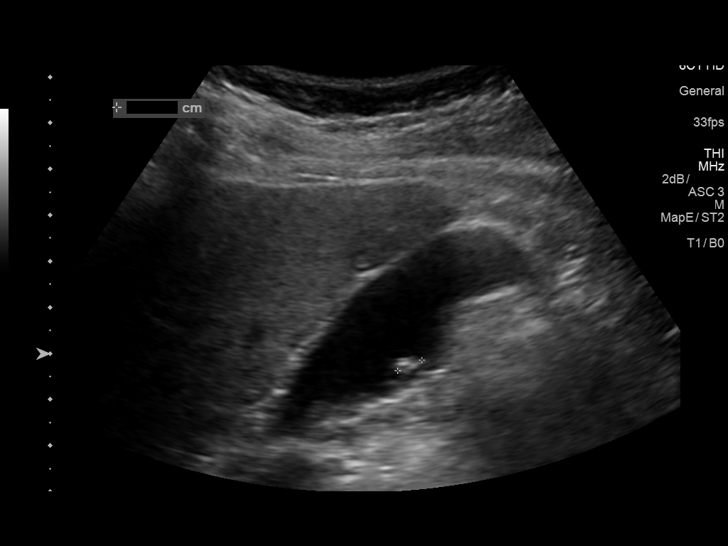
[im 9/52]
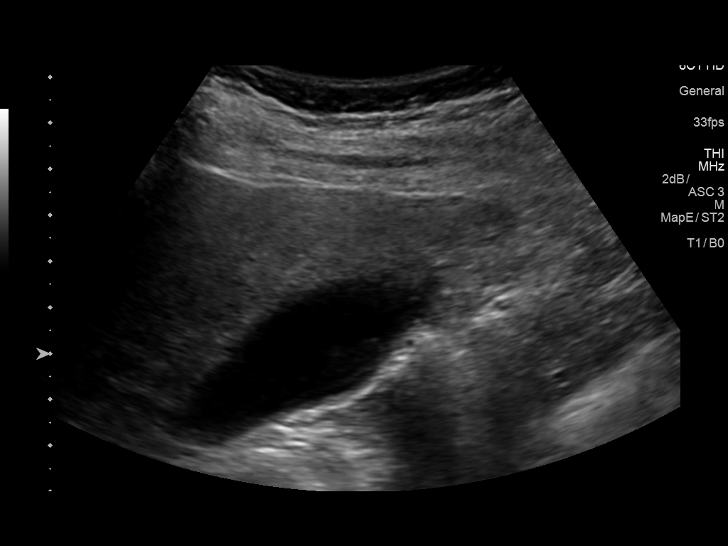
[im 13/52]
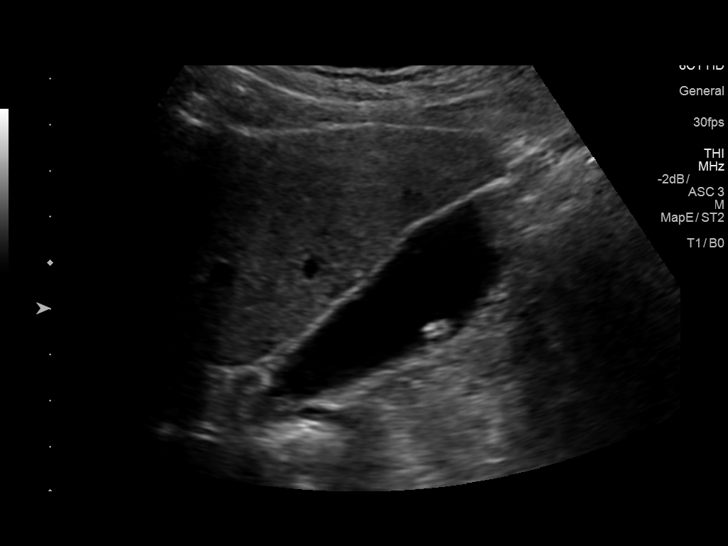
[im 18/52]
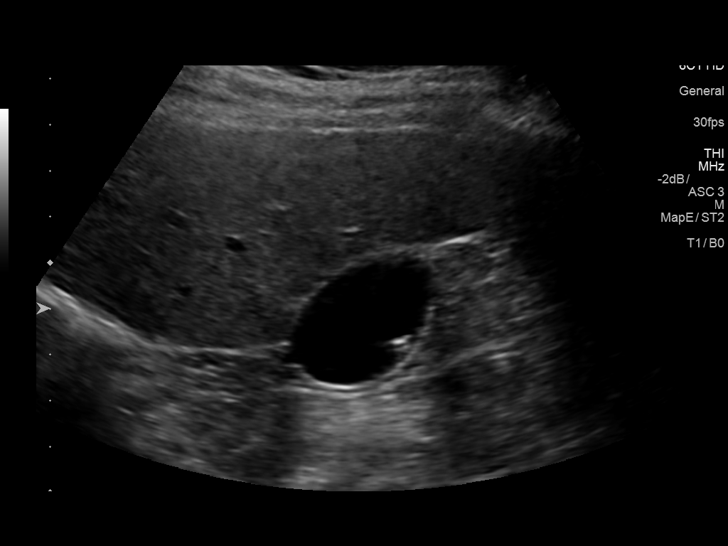
[im 20/52]
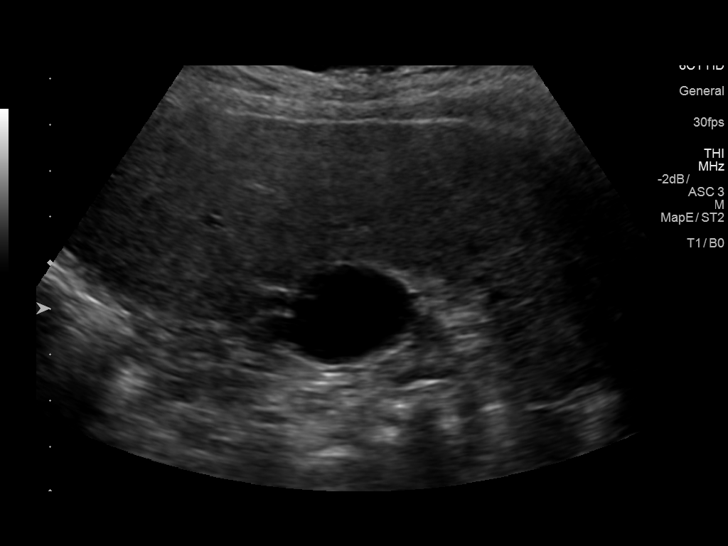
[im 24/52]
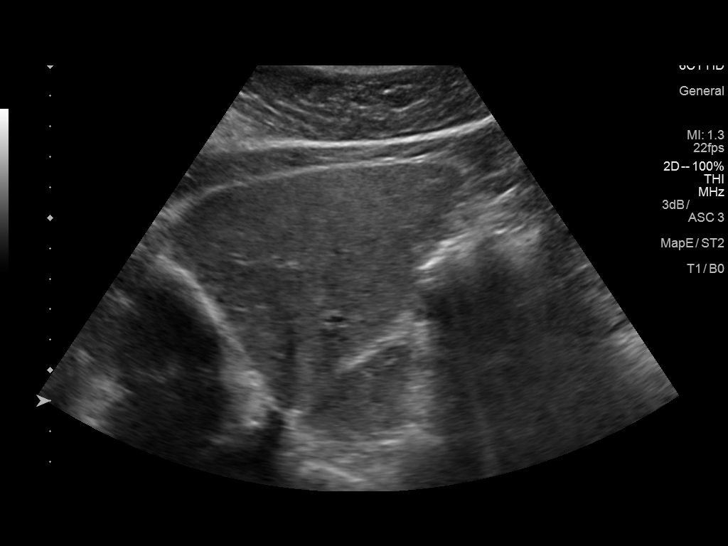
[im 28/52]
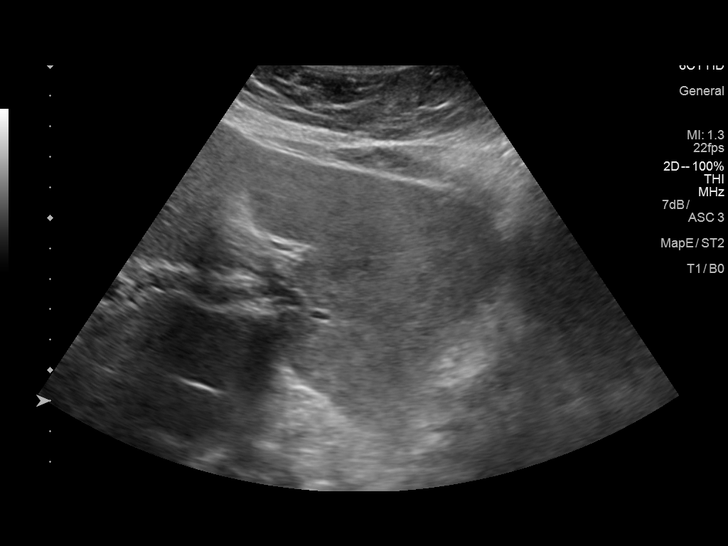
[im 32/52]
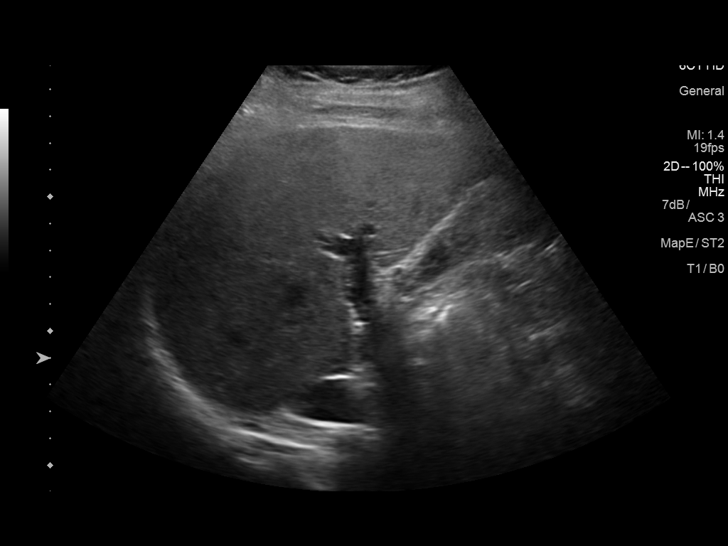
[im 35/52]
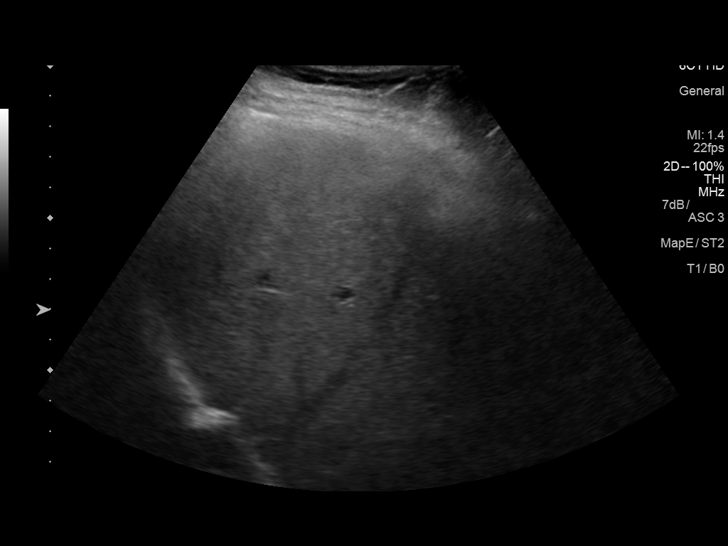
[im 39/52]
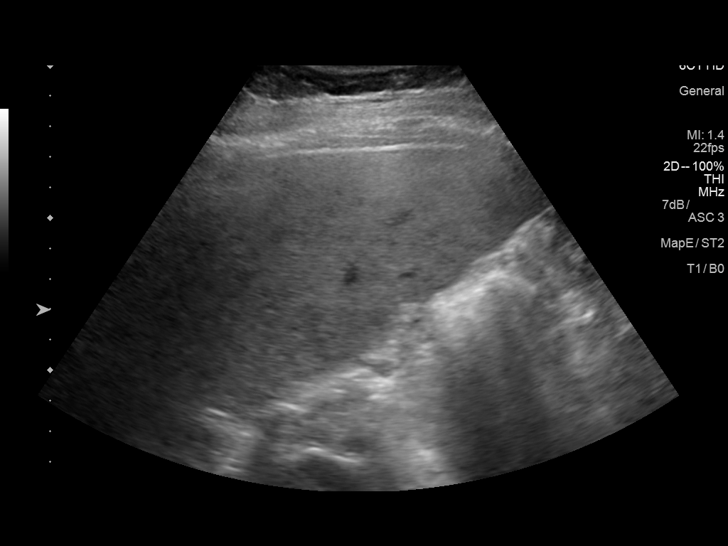
[im 43/52]
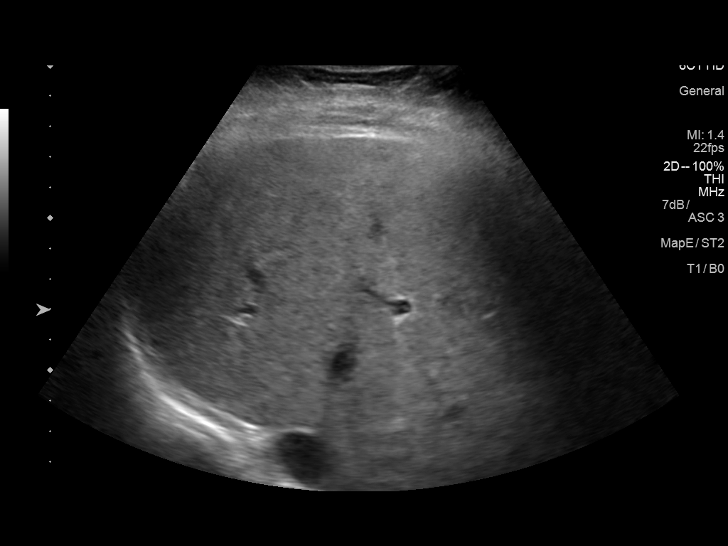
[im 47/52]
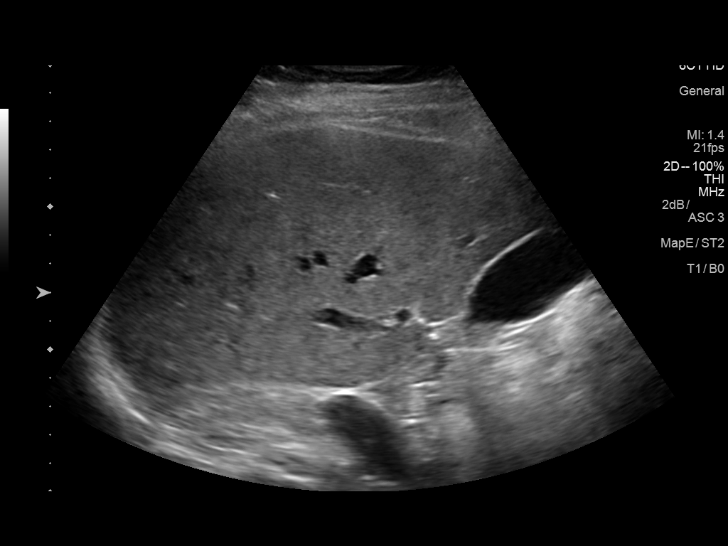
[im 52/52]
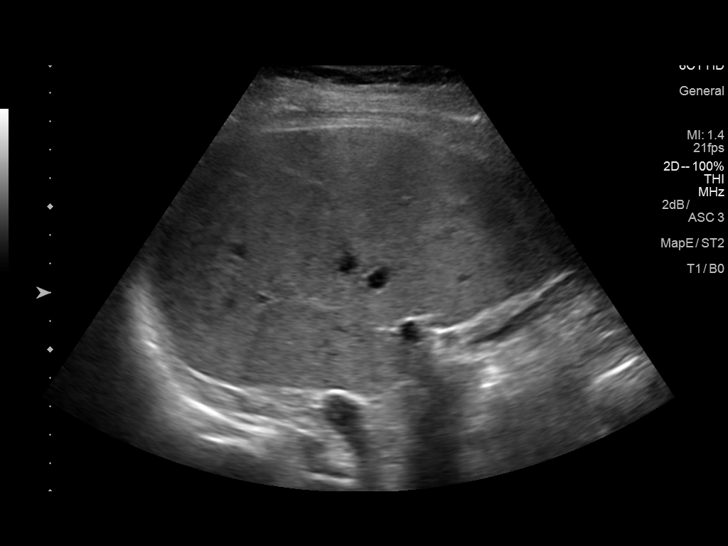

[14 of 25 positions shown; findings below may reference images not displayed]

FINDINGS: Gallbladder:

Gallbladder is well distended. Gallstones are identified. No wall
thickening or pericholecystic fluid is noted. Negative sonographic
Murphy's sign is elicited.

Common bile duct:

Diameter: 3.7 mm.

Liver:

No focal lesion identified. Within normal limits in parenchymal
echogenicity. Portal vein is patent on color Doppler imaging with
normal direction of blood flow towards the liver.
IMPRESSION: Cholelithiasis without complicating factors.

## 2020-05-03 DIAGNOSIS — J432 Centrilobular emphysema: Secondary | ICD-10-CM | POA: Diagnosis not present

## 2020-05-03 DIAGNOSIS — R0600 Dyspnea, unspecified: Secondary | ICD-10-CM | POA: Diagnosis not present

## 2020-05-03 DIAGNOSIS — G4734 Idiopathic sleep related nonobstructive alveolar hypoventilation: Secondary | ICD-10-CM | POA: Diagnosis not present

## 2020-05-03 DIAGNOSIS — J449 Chronic obstructive pulmonary disease, unspecified: Secondary | ICD-10-CM | POA: Diagnosis not present

## 2020-05-04 DIAGNOSIS — Z79899 Other long term (current) drug therapy: Secondary | ICD-10-CM | POA: Diagnosis not present

## 2020-05-04 DIAGNOSIS — I10 Essential (primary) hypertension: Secondary | ICD-10-CM | POA: Diagnosis not present

## 2020-05-04 DIAGNOSIS — Z6839 Body mass index (BMI) 39.0-39.9, adult: Secondary | ICD-10-CM | POA: Diagnosis not present

## 2020-05-04 DIAGNOSIS — G8929 Other chronic pain: Secondary | ICD-10-CM | POA: Diagnosis not present

## 2020-05-04 DIAGNOSIS — R5383 Other fatigue: Secondary | ICD-10-CM | POA: Diagnosis not present

## 2020-05-04 DIAGNOSIS — E782 Mixed hyperlipidemia: Secondary | ICD-10-CM | POA: Diagnosis not present

## 2020-05-04 DIAGNOSIS — E559 Vitamin D deficiency, unspecified: Secondary | ICD-10-CM | POA: Diagnosis not present

## 2020-05-04 DIAGNOSIS — M545 Low back pain: Secondary | ICD-10-CM | POA: Diagnosis not present

## 2020-05-04 DIAGNOSIS — E8881 Metabolic syndrome: Secondary | ICD-10-CM | POA: Diagnosis not present

## 2020-05-04 DIAGNOSIS — M791 Myalgia, unspecified site: Secondary | ICD-10-CM | POA: Diagnosis not present

## 2020-05-08 DIAGNOSIS — R0902 Hypoxemia: Secondary | ICD-10-CM | POA: Diagnosis not present

## 2020-05-08 DIAGNOSIS — J449 Chronic obstructive pulmonary disease, unspecified: Secondary | ICD-10-CM | POA: Diagnosis not present

## 2020-05-13 DIAGNOSIS — G4734 Idiopathic sleep related nonobstructive alveolar hypoventilation: Secondary | ICD-10-CM | POA: Insufficient documentation

## 2020-05-13 HISTORY — DX: Idiopathic sleep related nonobstructive alveolar hypoventilation: G47.34

## 2020-07-11 DIAGNOSIS — S92511A Displaced fracture of proximal phalanx of right lesser toe(s), initial encounter for closed fracture: Secondary | ICD-10-CM | POA: Diagnosis not present

## 2020-07-12 DIAGNOSIS — C641 Malignant neoplasm of right kidney, except renal pelvis: Secondary | ICD-10-CM | POA: Diagnosis not present

## 2020-07-12 DIAGNOSIS — K760 Fatty (change of) liver, not elsewhere classified: Secondary | ICD-10-CM | POA: Diagnosis not present

## 2020-07-12 DIAGNOSIS — Z85528 Personal history of other malignant neoplasm of kidney: Secondary | ICD-10-CM | POA: Diagnosis not present

## 2020-08-03 DIAGNOSIS — Z905 Acquired absence of kidney: Secondary | ICD-10-CM | POA: Diagnosis not present

## 2020-08-03 DIAGNOSIS — C649 Malignant neoplasm of unspecified kidney, except renal pelvis: Secondary | ICD-10-CM | POA: Diagnosis not present

## 2020-08-10 DIAGNOSIS — Z23 Encounter for immunization: Secondary | ICD-10-CM | POA: Diagnosis not present

## 2020-09-07 DIAGNOSIS — Z6839 Body mass index (BMI) 39.0-39.9, adult: Secondary | ICD-10-CM | POA: Diagnosis not present

## 2020-09-07 DIAGNOSIS — N390 Urinary tract infection, site not specified: Secondary | ICD-10-CM | POA: Diagnosis not present

## 2020-09-23 DIAGNOSIS — R899 Unspecified abnormal finding in specimens from other organs, systems and tissues: Secondary | ICD-10-CM | POA: Diagnosis not present

## 2020-09-23 DIAGNOSIS — R159 Full incontinence of feces: Secondary | ICD-10-CM | POA: Diagnosis not present

## 2020-09-23 DIAGNOSIS — A048 Other specified bacterial intestinal infections: Secondary | ICD-10-CM | POA: Diagnosis not present

## 2020-10-31 DIAGNOSIS — R079 Chest pain, unspecified: Secondary | ICD-10-CM | POA: Diagnosis not present

## 2020-10-31 DIAGNOSIS — G933 Postviral fatigue syndrome: Secondary | ICD-10-CM | POA: Diagnosis not present

## 2020-10-31 DIAGNOSIS — Z8616 Personal history of COVID-19: Secondary | ICD-10-CM | POA: Diagnosis not present

## 2020-10-31 DIAGNOSIS — Z87891 Personal history of nicotine dependence: Secondary | ICD-10-CM | POA: Diagnosis not present

## 2020-10-31 DIAGNOSIS — J432 Centrilobular emphysema: Secondary | ICD-10-CM | POA: Diagnosis not present

## 2020-11-10 DIAGNOSIS — A048 Other specified bacterial intestinal infections: Secondary | ICD-10-CM | POA: Diagnosis not present

## 2020-11-16 DIAGNOSIS — R0600 Dyspnea, unspecified: Secondary | ICD-10-CM | POA: Diagnosis not present

## 2020-11-16 DIAGNOSIS — R0902 Hypoxemia: Secondary | ICD-10-CM | POA: Diagnosis not present

## 2020-11-16 DIAGNOSIS — J441 Chronic obstructive pulmonary disease with (acute) exacerbation: Secondary | ICD-10-CM | POA: Diagnosis not present

## 2020-11-16 DIAGNOSIS — R079 Chest pain, unspecified: Secondary | ICD-10-CM | POA: Diagnosis not present

## 2020-11-16 DIAGNOSIS — R0602 Shortness of breath: Secondary | ICD-10-CM | POA: Diagnosis not present

## 2020-11-16 DIAGNOSIS — Z20828 Contact with and (suspected) exposure to other viral communicable diseases: Secondary | ICD-10-CM | POA: Diagnosis not present

## 2020-11-16 DIAGNOSIS — R0789 Other chest pain: Secondary | ICD-10-CM | POA: Diagnosis not present

## 2020-11-23 DIAGNOSIS — K589 Irritable bowel syndrome without diarrhea: Secondary | ICD-10-CM | POA: Diagnosis not present

## 2020-12-06 DIAGNOSIS — J432 Centrilobular emphysema: Secondary | ICD-10-CM | POA: Diagnosis not present

## 2020-12-06 DIAGNOSIS — F17210 Nicotine dependence, cigarettes, uncomplicated: Secondary | ICD-10-CM | POA: Diagnosis not present

## 2020-12-06 DIAGNOSIS — Z8616 Personal history of COVID-19: Secondary | ICD-10-CM | POA: Diagnosis not present

## 2020-12-06 DIAGNOSIS — G4734 Idiopathic sleep related nonobstructive alveolar hypoventilation: Secondary | ICD-10-CM | POA: Diagnosis not present

## 2020-12-06 DIAGNOSIS — G933 Postviral fatigue syndrome: Secondary | ICD-10-CM | POA: Diagnosis not present

## 2020-12-19 DIAGNOSIS — G4730 Sleep apnea, unspecified: Secondary | ICD-10-CM | POA: Diagnosis not present

## 2020-12-19 DIAGNOSIS — R0683 Snoring: Secondary | ICD-10-CM | POA: Diagnosis not present

## 2021-02-07 DIAGNOSIS — J449 Chronic obstructive pulmonary disease, unspecified: Secondary | ICD-10-CM | POA: Diagnosis not present

## 2021-02-07 DIAGNOSIS — F419 Anxiety disorder, unspecified: Secondary | ICD-10-CM | POA: Diagnosis not present

## 2021-02-07 DIAGNOSIS — Z6841 Body Mass Index (BMI) 40.0 and over, adult: Secondary | ICD-10-CM | POA: Diagnosis not present

## 2021-02-09 DIAGNOSIS — R5383 Other fatigue: Secondary | ICD-10-CM | POA: Diagnosis not present

## 2021-02-09 DIAGNOSIS — E8881 Metabolic syndrome: Secondary | ICD-10-CM | POA: Diagnosis not present

## 2021-02-09 DIAGNOSIS — E559 Vitamin D deficiency, unspecified: Secondary | ICD-10-CM | POA: Diagnosis not present

## 2021-02-09 DIAGNOSIS — Z79899 Other long term (current) drug therapy: Secondary | ICD-10-CM | POA: Diagnosis not present

## 2021-02-09 DIAGNOSIS — E782 Mixed hyperlipidemia: Secondary | ICD-10-CM | POA: Diagnosis not present

## 2021-02-27 DIAGNOSIS — D72828 Other elevated white blood cell count: Secondary | ICD-10-CM | POA: Diagnosis not present

## 2021-02-27 DIAGNOSIS — R7401 Elevation of levels of liver transaminase levels: Secondary | ICD-10-CM | POA: Diagnosis not present

## 2021-03-01 DIAGNOSIS — I10 Essential (primary) hypertension: Secondary | ICD-10-CM | POA: Diagnosis not present

## 2021-03-01 DIAGNOSIS — H5203 Hypermetropia, bilateral: Secondary | ICD-10-CM | POA: Diagnosis not present

## 2021-03-01 DIAGNOSIS — H25813 Combined forms of age-related cataract, bilateral: Secondary | ICD-10-CM | POA: Diagnosis not present

## 2021-03-01 DIAGNOSIS — H353131 Nonexudative age-related macular degeneration, bilateral, early dry stage: Secondary | ICD-10-CM | POA: Diagnosis not present

## 2021-03-01 DIAGNOSIS — H52223 Regular astigmatism, bilateral: Secondary | ICD-10-CM | POA: Diagnosis not present

## 2021-03-01 DIAGNOSIS — H524 Presbyopia: Secondary | ICD-10-CM | POA: Diagnosis not present

## 2021-03-09 DIAGNOSIS — J441 Chronic obstructive pulmonary disease with (acute) exacerbation: Secondary | ICD-10-CM | POA: Diagnosis not present

## 2021-03-09 DIAGNOSIS — Z7951 Long term (current) use of inhaled steroids: Secondary | ICD-10-CM | POA: Diagnosis not present

## 2021-03-09 DIAGNOSIS — G4734 Idiopathic sleep related nonobstructive alveolar hypoventilation: Secondary | ICD-10-CM | POA: Diagnosis not present

## 2021-03-09 DIAGNOSIS — Z9981 Dependence on supplemental oxygen: Secondary | ICD-10-CM | POA: Diagnosis not present

## 2021-03-09 DIAGNOSIS — Z8616 Personal history of COVID-19: Secondary | ICD-10-CM | POA: Diagnosis not present

## 2021-03-09 DIAGNOSIS — F17210 Nicotine dependence, cigarettes, uncomplicated: Secondary | ICD-10-CM | POA: Diagnosis not present

## 2021-03-10 DIAGNOSIS — U099 Post covid-19 condition, unspecified: Secondary | ICD-10-CM | POA: Insufficient documentation

## 2021-03-29 DIAGNOSIS — J432 Centrilobular emphysema: Secondary | ICD-10-CM | POA: Diagnosis not present

## 2021-03-29 DIAGNOSIS — Z8701 Personal history of pneumonia (recurrent): Secondary | ICD-10-CM | POA: Diagnosis not present

## 2021-03-29 DIAGNOSIS — Z87891 Personal history of nicotine dependence: Secondary | ICD-10-CM | POA: Diagnosis not present

## 2021-03-29 DIAGNOSIS — G4734 Idiopathic sleep related nonobstructive alveolar hypoventilation: Secondary | ICD-10-CM | POA: Diagnosis not present

## 2021-03-29 DIAGNOSIS — I2510 Atherosclerotic heart disease of native coronary artery without angina pectoris: Secondary | ICD-10-CM | POA: Diagnosis not present

## 2021-03-29 DIAGNOSIS — Z8616 Personal history of COVID-19: Secondary | ICD-10-CM | POA: Diagnosis not present

## 2021-03-29 DIAGNOSIS — I700 Atherosclerosis of aorta: Secondary | ICD-10-CM | POA: Diagnosis not present

## 2021-05-19 DIAGNOSIS — U099 Post covid-19 condition, unspecified: Secondary | ICD-10-CM | POA: Diagnosis not present

## 2021-05-19 DIAGNOSIS — R0600 Dyspnea, unspecified: Secondary | ICD-10-CM | POA: Diagnosis not present

## 2021-05-19 DIAGNOSIS — R911 Solitary pulmonary nodule: Secondary | ICD-10-CM | POA: Diagnosis not present

## 2021-05-19 DIAGNOSIS — G4734 Idiopathic sleep related nonobstructive alveolar hypoventilation: Secondary | ICD-10-CM | POA: Diagnosis not present

## 2021-05-19 DIAGNOSIS — F17210 Nicotine dependence, cigarettes, uncomplicated: Secondary | ICD-10-CM | POA: Diagnosis not present

## 2021-05-19 DIAGNOSIS — Z9981 Dependence on supplemental oxygen: Secondary | ICD-10-CM | POA: Diagnosis not present

## 2021-05-19 DIAGNOSIS — J449 Chronic obstructive pulmonary disease, unspecified: Secondary | ICD-10-CM | POA: Diagnosis not present

## 2021-08-01 DIAGNOSIS — W548XXA Other contact with dog, initial encounter: Secondary | ICD-10-CM | POA: Diagnosis not present

## 2021-08-01 DIAGNOSIS — R252 Cramp and spasm: Secondary | ICD-10-CM | POA: Diagnosis not present

## 2021-08-01 DIAGNOSIS — M542 Cervicalgia: Secondary | ICD-10-CM | POA: Diagnosis not present

## 2021-08-01 DIAGNOSIS — M5441 Lumbago with sciatica, right side: Secondary | ICD-10-CM | POA: Diagnosis not present

## 2021-08-01 DIAGNOSIS — E538 Deficiency of other specified B group vitamins: Secondary | ICD-10-CM | POA: Diagnosis not present

## 2021-08-01 DIAGNOSIS — Z23 Encounter for immunization: Secondary | ICD-10-CM | POA: Diagnosis not present

## 2021-08-02 ENCOUNTER — Other Ambulatory Visit: Payer: Self-pay

## 2021-08-04 DIAGNOSIS — K5730 Diverticulosis of large intestine without perforation or abscess without bleeding: Secondary | ICD-10-CM | POA: Diagnosis not present

## 2021-08-04 DIAGNOSIS — M47812 Spondylosis without myelopathy or radiculopathy, cervical region: Secondary | ICD-10-CM | POA: Diagnosis not present

## 2021-08-04 DIAGNOSIS — J439 Emphysema, unspecified: Secondary | ICD-10-CM | POA: Diagnosis not present

## 2021-08-04 DIAGNOSIS — C649 Malignant neoplasm of unspecified kidney, except renal pelvis: Secondary | ICD-10-CM | POA: Diagnosis not present

## 2021-08-04 DIAGNOSIS — N281 Cyst of kidney, acquired: Secondary | ICD-10-CM | POA: Diagnosis not present

## 2021-08-04 DIAGNOSIS — I700 Atherosclerosis of aorta: Secondary | ICD-10-CM | POA: Diagnosis not present

## 2021-08-10 DIAGNOSIS — Z85528 Personal history of other malignant neoplasm of kidney: Secondary | ICD-10-CM | POA: Diagnosis not present

## 2021-08-10 DIAGNOSIS — C641 Malignant neoplasm of right kidney, except renal pelvis: Secondary | ICD-10-CM

## 2021-08-10 HISTORY — DX: Malignant neoplasm of right kidney, except renal pelvis: C64.1

## 2021-08-14 DIAGNOSIS — J432 Centrilobular emphysema: Secondary | ICD-10-CM | POA: Diagnosis not present

## 2021-08-14 DIAGNOSIS — F17210 Nicotine dependence, cigarettes, uncomplicated: Secondary | ICD-10-CM | POA: Diagnosis not present

## 2021-08-14 DIAGNOSIS — G4734 Idiopathic sleep related nonobstructive alveolar hypoventilation: Secondary | ICD-10-CM | POA: Diagnosis not present

## 2021-08-14 DIAGNOSIS — R0609 Other forms of dyspnea: Secondary | ICD-10-CM | POA: Diagnosis not present

## 2021-08-14 DIAGNOSIS — Z8616 Personal history of COVID-19: Secondary | ICD-10-CM | POA: Diagnosis not present

## 2021-09-19 DIAGNOSIS — M542 Cervicalgia: Secondary | ICD-10-CM | POA: Diagnosis not present

## 2021-09-19 DIAGNOSIS — M47812 Spondylosis without myelopathy or radiculopathy, cervical region: Secondary | ICD-10-CM | POA: Diagnosis not present

## 2021-09-21 ENCOUNTER — Other Ambulatory Visit: Payer: Self-pay | Admitting: Rehabilitation

## 2021-09-21 DIAGNOSIS — M542 Cervicalgia: Secondary | ICD-10-CM

## 2021-09-21 DIAGNOSIS — M47812 Spondylosis without myelopathy or radiculopathy, cervical region: Secondary | ICD-10-CM

## 2021-12-19 DIAGNOSIS — Z20822 Contact with and (suspected) exposure to covid-19: Secondary | ICD-10-CM | POA: Diagnosis not present

## 2022-01-22 DIAGNOSIS — Z20822 Contact with and (suspected) exposure to covid-19: Secondary | ICD-10-CM | POA: Diagnosis not present

## 2022-01-25 DIAGNOSIS — Z20822 Contact with and (suspected) exposure to covid-19: Secondary | ICD-10-CM | POA: Diagnosis not present

## 2022-02-12 DIAGNOSIS — Z20822 Contact with and (suspected) exposure to covid-19: Secondary | ICD-10-CM | POA: Diagnosis not present

## 2022-02-15 DIAGNOSIS — M25562 Pain in left knee: Secondary | ICD-10-CM | POA: Diagnosis not present

## 2022-02-26 DIAGNOSIS — M1712 Unilateral primary osteoarthritis, left knee: Secondary | ICD-10-CM | POA: Diagnosis not present

## 2022-02-26 DIAGNOSIS — M25562 Pain in left knee: Secondary | ICD-10-CM | POA: Diagnosis not present

## 2022-03-20 DIAGNOSIS — Z6838 Body mass index (BMI) 38.0-38.9, adult: Secondary | ICD-10-CM | POA: Diagnosis not present

## 2022-03-20 DIAGNOSIS — Z131 Encounter for screening for diabetes mellitus: Secondary | ICD-10-CM | POA: Diagnosis not present

## 2022-03-20 DIAGNOSIS — E538 Deficiency of other specified B group vitamins: Secondary | ICD-10-CM | POA: Diagnosis not present

## 2022-03-20 DIAGNOSIS — E785 Hyperlipidemia, unspecified: Secondary | ICD-10-CM | POA: Diagnosis not present

## 2022-03-20 DIAGNOSIS — K529 Noninfective gastroenteritis and colitis, unspecified: Secondary | ICD-10-CM | POA: Diagnosis not present

## 2022-03-20 DIAGNOSIS — D692 Other nonthrombocytopenic purpura: Secondary | ICD-10-CM | POA: Diagnosis not present

## 2022-03-20 DIAGNOSIS — E559 Vitamin D deficiency, unspecified: Secondary | ICD-10-CM | POA: Diagnosis not present

## 2022-03-20 DIAGNOSIS — R5383 Other fatigue: Secondary | ICD-10-CM | POA: Diagnosis not present

## 2022-03-29 DIAGNOSIS — Z87891 Personal history of nicotine dependence: Secondary | ICD-10-CM | POA: Diagnosis not present

## 2022-03-29 DIAGNOSIS — I2510 Atherosclerotic heart disease of native coronary artery without angina pectoris: Secondary | ICD-10-CM | POA: Diagnosis not present

## 2022-03-29 DIAGNOSIS — F17210 Nicotine dependence, cigarettes, uncomplicated: Secondary | ICD-10-CM | POA: Diagnosis not present

## 2022-03-29 DIAGNOSIS — I700 Atherosclerosis of aorta: Secondary | ICD-10-CM | POA: Diagnosis not present

## 2022-03-29 DIAGNOSIS — Z122 Encounter for screening for malignant neoplasm of respiratory organs: Secondary | ICD-10-CM | POA: Diagnosis not present

## 2022-04-12 DIAGNOSIS — J432 Centrilobular emphysema: Secondary | ICD-10-CM | POA: Diagnosis not present

## 2022-04-12 DIAGNOSIS — F17210 Nicotine dependence, cigarettes, uncomplicated: Secondary | ICD-10-CM | POA: Diagnosis not present

## 2022-05-01 DIAGNOSIS — R202 Paresthesia of skin: Secondary | ICD-10-CM | POA: Diagnosis not present

## 2022-05-01 DIAGNOSIS — R200 Anesthesia of skin: Secondary | ICD-10-CM | POA: Diagnosis not present

## 2022-05-01 DIAGNOSIS — K115 Sialolithiasis: Secondary | ICD-10-CM | POA: Diagnosis not present

## 2022-05-14 DIAGNOSIS — G500 Trigeminal neuralgia: Secondary | ICD-10-CM | POA: Diagnosis not present

## 2022-05-14 DIAGNOSIS — Z6837 Body mass index (BMI) 37.0-37.9, adult: Secondary | ICD-10-CM | POA: Diagnosis not present

## 2022-05-21 DIAGNOSIS — M26609 Unspecified temporomandibular joint disorder, unspecified side: Secondary | ICD-10-CM | POA: Diagnosis not present

## 2022-05-21 DIAGNOSIS — Z6837 Body mass index (BMI) 37.0-37.9, adult: Secondary | ICD-10-CM | POA: Diagnosis not present

## 2022-05-21 DIAGNOSIS — E538 Deficiency of other specified B group vitamins: Secondary | ICD-10-CM | POA: Diagnosis not present

## 2022-05-21 DIAGNOSIS — G500 Trigeminal neuralgia: Secondary | ICD-10-CM | POA: Diagnosis not present

## 2022-06-20 DIAGNOSIS — K146 Glossodynia: Secondary | ICD-10-CM | POA: Diagnosis not present

## 2022-06-20 DIAGNOSIS — Z6838 Body mass index (BMI) 38.0-38.9, adult: Secondary | ICD-10-CM | POA: Diagnosis not present

## 2022-06-20 DIAGNOSIS — G500 Trigeminal neuralgia: Secondary | ICD-10-CM | POA: Diagnosis not present

## 2022-06-20 DIAGNOSIS — E559 Vitamin D deficiency, unspecified: Secondary | ICD-10-CM | POA: Diagnosis not present

## 2022-06-20 DIAGNOSIS — D692 Other nonthrombocytopenic purpura: Secondary | ICD-10-CM | POA: Diagnosis not present

## 2022-06-20 DIAGNOSIS — J45909 Unspecified asthma, uncomplicated: Secondary | ICD-10-CM | POA: Diagnosis not present

## 2022-07-12 ENCOUNTER — Encounter: Payer: Self-pay | Admitting: Neurology

## 2022-07-12 ENCOUNTER — Ambulatory Visit (INDEPENDENT_AMBULATORY_CARE_PROVIDER_SITE_OTHER): Payer: Medicare Other | Admitting: Neurology

## 2022-07-12 ENCOUNTER — Telehealth: Payer: Self-pay | Admitting: Neurology

## 2022-07-12 VITALS — BP 160/100 | HR 90 | Ht 62.0 in | Wt 199.0 lb

## 2022-07-12 DIAGNOSIS — R519 Headache, unspecified: Secondary | ICD-10-CM

## 2022-07-12 DIAGNOSIS — R202 Paresthesia of skin: Secondary | ICD-10-CM | POA: Diagnosis not present

## 2022-07-12 DIAGNOSIS — G9339 Other post infection and related fatigue syndromes: Secondary | ICD-10-CM | POA: Diagnosis not present

## 2022-07-12 HISTORY — DX: Paresthesia of skin: R20.2

## 2022-07-12 HISTORY — DX: Headache, unspecified: R51.9

## 2022-07-12 MED ORDER — GABAPENTIN 300 MG PO CAPS: 300.0000 mg | ORAL_CAPSULE | Freq: Three times a day (TID) | ORAL | 3 refills | Status: DC | PRN

## 2022-07-12 NOTE — Telephone Encounter (Signed)
medicare/[place] sup NPR sent to GI [phone]

## 2022-07-12 NOTE — Progress Notes (Addendum)
Chief Complaint  Patient presents with   New Patient (Initial Visit)    Rm 15. Alone. NP/Paper proficient/Jennifer Pamella Pert NP [facility]/Trigeminal neuralgia right side of face.      ASSESSMENT AND PLAN  Holly Hardy is a 71 y.o. female Right facial pain  History are not typical for trigeminal neuralgia, she complains of right facial paresthesia, only intermittent sharp radiating pain  Suboptimal control with Trileptal 300 mg twice a day, will add on gabapentin 300 mg 3 times a day as needed  MRI of the brain with without contrast through trigeminal ganglion  Laboratory evaluations  Return to clinic with nurse practitioner in 2 months   DIAGNOSTIC DATA (LABS, IMAGING, TESTING) - I reviewed patient records, labs, notes, testing and imaging myself where available.   MEDICAL HISTORY:  Holly Hardy is a 71 year old female, seen in request by her primary care nurse practitioner Carvel Getting Key, for evaluation of right facial pain, initial evaluation was July 12, 2022  I reviewed and summarized the referring note. PMHX HTN Anxiety Right nephrectomy in 2018. Diverticulitis   Around beginning of September 2023, she began to notice right ear achy pain, then began to notice right facial numbness, right upper and lower tooth discomfort, was seen by dentist initially, had dental x-ray, no significant abnormality found  Right facial discomfort continue to getting worse, mainly involving right upper and lower tooth, cheek area, some extension to right jaw, sparing right eye, forehead, she complains of constant deep achy pain, with occasionally transient sharp radiating pain, she also complains intermittent right tongue discomfort,  She was started on Trileptal 300 mg twice a day, seemed to help her symptoms, but she complains of dizziness, brain fog with medications   PHYSICAL EXAM:   Vitals:   07/12/22 1421  BP: (!) 160/100  Pulse: 90  Weight: 199 lb (90.3  kg)  Height: '5\' 2"'$  (1.575 m)   Not recorded     Body mass index is 36.4 kg/m.  PHYSICAL EXAMNIATION:  Gen: NAD, conversant, well nourised, well groomed                     Cardiovascular: Regular rate rhythm, no peripheral edema, warm, nontender. Eyes: Conjunctivae clear without exudates or hemorrhage Neck: Supple, no carotid bruits. Pulmonary: Clear to auscultation bilaterally   NEUROLOGICAL EXAM:  MENTAL STATUS: Speech/cognition: Awake, alert, oriented to history taking and casual conversation CRANIAL NERVES: CN II: Visual fields are full to confrontation. Pupils are round equal and briskly reactive to light. CN III, IV, VI: extraocular movement are normal. No ptosis. CN V: Facial sensation is intact to light touch, bilateral corneal reflex were symmetric CN VII: Face is symmetric with normal eye closure  CN VIII: Hearing is normal to causal conversation. CN IX, X: Phonation is normal. CN XI: Head turning and shoulder shrug are intact  MOTOR: There is no pronator drift of out-stretched arms. Muscle bulk and tone are normal. Muscle strength is normal.  REFLEXES: Reflexes are 2+ and symmetric at the biceps, triceps, knees, and ankles. Plantar responses are flexor.  SENSORY: Intact to light touch, pinprick and vibratory sensation are intact in fingers and toes.  COORDINATION: There is no trunk or limb dysmetria noted.  GAIT/STANCE: Posture is normal. Gait is steady   REVIEW OF SYSTEMS:  Full 14 system review of systems performed and notable only for as above All other review of systems were negative.   ALLERGIES: Allergies  Allergen Reactions   Codeine  Swelling   Meperidine Itching and Swelling    HOME MEDICATIONS: Current Outpatient Medications  Medication Sig Dispense Refill   cyanocobalamin (VITAMIN B12) 1000 MCG/ML injection Inject 1,000 mcg into the muscle once a week.     ergocalciferol (VITAMIN D2) 1.25 MG (50000 UT) capsule Take 50,000 Units by  mouth once a week.     fluticasone furoate-vilanterol (BREO ELLIPTA) 100-25 MCG/ACT AEPB Inhale 1 puff into the lungs daily.     ipratropium-albuterol (DUONEB) 0.5-2.5 (3) MG/3ML SOLN Take 3 mLs by nebulization.     montelukast (SINGULAIR) 10 MG tablet Take 10 mg by mouth daily.     Oxcarbazepine (TRILEPTAL) 300 MG tablet Take 300 mg by mouth 2 (two) times daily.     OXYCODONE HCL PO Take 1 tablet by mouth as needed.     sertraline (ZOLOFT) 25 MG tablet Take 25 mg by mouth daily.     valsartan (DIOVAN) 160 MG tablet Take 1 tablet by mouth daily.     VENTOLIN HFA 108 (90 Base) MCG/ACT inhaler Inhale 2 puffs into the lungs every 6 (six) hours as needed.     No current facility-administered medications for this visit.    PAST MEDICAL HISTORY: Past Medical History:  Diagnosis Date   Diverticulitis     PAST SURGICAL HISTORY: Past Surgical History:  Procedure Laterality Date   NEPHRECTOMY RADICAL      FAMILY HISTORY: Family History  Problem Relation Age of Onset   Kidney failure Mother    Liver cancer Father    Breast cancer Maternal Aunt     SOCIAL HISTORY: Social History   Socioeconomic History   Marital status: Married    Spouse name: Not on file   Number of children: Not on file   Years of education: Not on file   Highest education level: Not on file  Occupational History   Not on file  Tobacco Use   Smoking status: Former    Types: Cigarettes    Quit date: 2020    Years since quitting: 3.7   Smokeless tobacco: Never  Substance and Sexual Activity   Alcohol use: Not Currently   Drug use: Not Currently    Comment: hx of cocaine use   Sexual activity: Not on file  Other Topics Concern   Not on file  Social History Narrative   Not on file   Social Determinants of Health   Financial Resource Strain: Not on file  Food Insecurity: Not on file  Transportation Needs: Not on file  Physical Activity: Not on file  Stress: Not on file  Social Connections: Not on  file  Intimate Partner Violence: Not on file      Marcial Pacas, M.D. Ph.D.  [facility] [address], [place], [place] [address] Ph: [phone] Fax: [phone]  CC:  Marga Hoots, NP [phone] [address] [place],   [id number]  Haydee Salter, NP

## 2022-07-14 LAB — COMPREHENSIVE METABOLIC PANEL
ALT: 14 IU/L (ref 0–32)
AST: 15 IU/L (ref 0–40)
Albumin/Globulin Ratio: 1.5 (ref 1.2–2.2)
Albumin: 4.4 g/dL (ref 3.8–4.8)
Alkaline Phosphatase: 117 IU/L (ref 44–121)
BUN/Creatinine Ratio: 16 (ref 12–28)
BUN: 13 mg/dL (ref 8–27)
Bilirubin Total: 0.6 mg/dL (ref 0.0–1.2)
CO2: 22 mmol/L (ref 20–29)
Calcium: 9.8 mg/dL (ref 8.7–10.3)
Chloride: 101 mmol/L (ref 96–106)
Creatinine, Ser: 0.83 mg/dL (ref 0.57–1.00)
Globulin, Total: 2.9 g/dL (ref 1.5–4.5)
Glucose: 89 mg/dL (ref 70–99)
Potassium: 4.4 mmol/L (ref 3.5–5.2)
Sodium: 141 mmol/L (ref 134–144)
Total Protein: 7.3 g/dL (ref 6.0–8.5)
eGFR: 75 mL/min/{1.73_m2} (ref >59)

## 2022-07-14 LAB — CBC WITH DIFFERENTIAL
Basophils Absolute: 0.1 10*3/uL (ref 0.0–0.2)
Basos: 1 %
EOS (ABSOLUTE): 0.2 10*3/uL (ref 0.0–0.4)
Eos: 1 %
Hematocrit: 42.6 % (ref 34.0–46.6)
Hemoglobin: 14.1 g/dL (ref 11.1–15.9)
Immature Grans (Abs): 0 10*3/uL (ref 0.0–0.1)
Immature Granulocytes: 0 %
Lymphocytes Absolute: 2.3 10*3/uL (ref 0.7–3.1)
Lymphs: 15 %
MCH: 29.4 pg (ref 26.6–33.0)
MCHC: 33.1 g/dL (ref 31.5–35.7)
MCV: 89 fL (ref 79–97)
Monocytes Absolute: 1.2 10*3/uL — ABNORMAL HIGH (ref 0.1–0.9)
Monocytes: 8 %
Neutrophils Absolute: 11.5 10*3/uL — ABNORMAL HIGH (ref 1.4–7.0)
Neutrophils: 75 %
RBC: 4.79 x10E6/uL (ref 3.77–5.28)
RDW: 12.3 % (ref 11.7–15.4)
WBC: 15.3 10*3/uL — ABNORMAL HIGH (ref 3.4–10.8)

## 2022-07-14 LAB — HGB A1C W/O EAG: Hgb A1c MFr Bld: 5.7 % — ABNORMAL HIGH (ref 4.8–5.6)

## 2022-07-14 LAB — TSH: TSH: 1.69 u[IU]/mL (ref 0.450–4.500)

## 2022-07-14 LAB — SEDIMENTATION RATE: Sed Rate: 29 mm/hr (ref 0–40)

## 2022-07-14 LAB — RPR: RPR Ser Ql: NONREACTIVE

## 2022-07-14 LAB — C-REACTIVE PROTEIN: CRP: 28 mg/L — ABNORMAL HIGH (ref 0–10)

## 2022-07-14 LAB — ANA W/REFLEX IF POSITIVE: Anti Nuclear Antibody (ANA): NEGATIVE

## 2022-07-14 LAB — 10-HYDROXYCARBAZEPINE: Oxcarbazepine SerPl-Mcnc: 3 ug/mL — ABNORMAL LOW (ref 10–35)

## 2022-07-16 ENCOUNTER — Telehealth: Payer: Self-pay | Admitting: Neurology

## 2022-07-16 NOTE — Addendum Note (Signed)
Addended by: Marcial Pacas on: 07/16/2022 01:41 PM   Modules accepted: Orders

## 2022-07-16 NOTE — Telephone Encounter (Signed)
Please call patient, laboratory evaluation showed elevation of C-reactive protein to 28, in the setting of normal ESR, above findings has unknown clinical significance  Also evidence of elevated WBC with evidence of elevated absolute neutrophil, and elevated monocyte, in the right clinical settings, above findings could represent active infection  Please check with patient to see if she has any signs of fever, upper respiratory, or UTI symptoms, she does, she should contact her primary care physician for evaluation,  IF Not, May consider repeat laboratory evaluation at her next scheduled follow-up   A1c slightly elevated 5.7 indicating mildl elevatedy glucose level over past few months, she may benefit moderate exercise, diet control.   Rest of the laboratory evaluation showed no significant abnormalities.

## 2022-07-24 DIAGNOSIS — K760 Fatty (change of) liver, not elsewhere classified: Secondary | ICD-10-CM | POA: Diagnosis not present

## 2022-07-24 DIAGNOSIS — I700 Atherosclerosis of aorta: Secondary | ICD-10-CM | POA: Diagnosis not present

## 2022-07-24 DIAGNOSIS — C641 Malignant neoplasm of right kidney, except renal pelvis: Secondary | ICD-10-CM | POA: Diagnosis not present

## 2022-07-24 DIAGNOSIS — C649 Malignant neoplasm of unspecified kidney, except renal pelvis: Secondary | ICD-10-CM | POA: Diagnosis not present

## 2022-07-24 DIAGNOSIS — N289 Disorder of kidney and ureter, unspecified: Secondary | ICD-10-CM | POA: Diagnosis not present

## 2022-07-31 ENCOUNTER — Other Ambulatory Visit: Payer: Medicare Other

## 2022-07-31 ENCOUNTER — Ambulatory Visit
Admission: RE | Admit: 2022-07-31 | Discharge: 2022-07-31 | Disposition: A | Discharge status: Home / Self Care | Payer: Medicare Other | Source: Ambulatory Visit | Attending: Neurology | Admitting: Neurology

## 2022-07-31 DIAGNOSIS — R519 Headache, unspecified: Secondary | ICD-10-CM

## 2022-07-31 DIAGNOSIS — R202 Paresthesia of skin: Secondary | ICD-10-CM

## 2022-07-31 DIAGNOSIS — G9339 Other post infection and related fatigue syndromes: Secondary | ICD-10-CM

## 2022-07-31 MED ORDER — GADOPICLENOL 0.5 MMOL/ML IV SOLN
10.0000 mL | Freq: Once | INTRAVENOUS | Status: AC | PRN
  Administered 2022-07-31: 10 mL via INTRAVENOUS

## 2022-08-03 DIAGNOSIS — C641 Malignant neoplasm of right kidney, except renal pelvis: Secondary | ICD-10-CM | POA: Diagnosis not present

## 2022-08-13 DIAGNOSIS — N289 Disorder of kidney and ureter, unspecified: Secondary | ICD-10-CM | POA: Insufficient documentation

## 2022-08-13 DIAGNOSIS — C641 Malignant neoplasm of right kidney, except renal pelvis: Secondary | ICD-10-CM | POA: Diagnosis not present

## 2022-09-05 NOTE — Progress Notes (Unsigned)
No chief complaint on file.   HISTORY OF PRESENT ILLNESS:  09/05/22 ALL:  Holly Hardy is a 71 y.o. female here today for follow up for atypical facial pain.    HISTORY (copied from Dr Rhea Belton previous note)  Holly Hardy is a 71 year old female, seen in request by her primary care nurse practitioner Carvel Getting Key, for evaluation of right facial pain, initial evaluation was July 12, 2022   I reviewed and summarized the referring note. PMHX HTN Anxiety Right nephrectomy in 2018. Diverticulitis   Around beginning of September 2023, she began to notice right ear achy pain, then began to notice right facial numbness, right upper and lower tooth discomfort, was seen by dentist initially, had dental x-ray, no significant abnormality found   Right facial discomfort continue to getting worse, mainly involving right upper and lower tooth, cheek area, some extension to right jaw, sparing right eye, forehead, she complains of constant deep achy pain, with occasionally transient sharp radiating pain, she also complains intermittent right tongue discomfort,   She was started on Trileptal 300 mg twice a day, seemed to help her symptoms, but she complains of dizziness, brain fog with medications   REVIEW OF SYSTEMS: Out of a complete 14 system review of symptoms, the patient complains only of the following symptoms, and all other reviewed systems are negative.   ALLERGIES: Allergies  Allergen Reactions   Codeine Swelling   Meperidine Itching and Swelling     HOME MEDICATIONS: Outpatient Medications Prior to Visit  Medication Sig Dispense Refill   cyanocobalamin (VITAMIN B12) 1000 MCG/ML injection Inject 1,000 mcg into the muscle once a week.     ergocalciferol (VITAMIN D2) 1.25 MG (50000 UT) capsule Take 50,000 Units by mouth once a week.     fluticasone furoate-vilanterol (BREO ELLIPTA) 100-25 MCG/ACT AEPB Inhale 1 puff into the lungs daily.     gabapentin (NEURONTIN)  300 MG capsule Take 1 capsule (300 mg total) by mouth 3 (three) times daily as needed. 90 capsule 3   ipratropium-albuterol (DUONEB) 0.5-2.5 (3) MG/3ML SOLN Take 3 mLs by nebulization.     montelukast (SINGULAIR) 10 MG tablet Take 10 mg by mouth daily.     Oxcarbazepine (TRILEPTAL) 300 MG tablet Take 300 mg by mouth 2 (two) times daily.     sertraline (ZOLOFT) 25 MG tablet Take 25 mg by mouth daily.     valsartan (DIOVAN) 160 MG tablet Take 1 tablet by mouth daily.     VENTOLIN HFA 108 (90 Base) MCG/ACT inhaler Inhale 2 puffs into the lungs every 6 (six) hours as needed.     No facility-administered medications prior to visit.     PAST MEDICAL HISTORY: Past Medical History:  Diagnosis Date   Diverticulitis      PAST SURGICAL HISTORY: Past Surgical History:  Procedure Laterality Date   NEPHRECTOMY RADICAL       FAMILY HISTORY: Family History  Problem Relation Age of Onset   Kidney failure Mother    Liver cancer Father    Breast cancer Maternal Aunt      SOCIAL HISTORY: Social History   Socioeconomic History   Marital status: Married    Spouse name: Not on file   Number of children: Not on file   Years of education: Not on file   Highest education level: Not on file  Occupational History   Not on file  Tobacco Use   Smoking status: Former    Types: Cigarettes  Quit date: 2020    Years since quitting: 3.9   Smokeless tobacco: Never  Substance and Sexual Activity   Alcohol use: Not Currently   Drug use: Not Currently    Comment: hx of cocaine use   Sexual activity: Not on file  Other Topics Concern   Not on file  Social History Narrative   Not on file   Social Determinants of Health   Financial Resource Strain: Not on file  Food Insecurity: Not on file  Transportation Needs: Not on file  Physical Activity: Not on file  Stress: Not on file  Social Connections: Not on file  Intimate Partner Violence: Not on file     PHYSICAL EXAM  There were no  vitals filed for this visit. There is no height or weight on file to calculate BMI.  Generalized: Well developed, in no acute distress  Cardiology: normal rate and rhythm, no murmur auscultated  Respiratory: clear to auscultation bilaterally    Neurological examination  Mentation: Alert oriented to time, place, history taking. Follows all commands speech and language fluent Cranial nerve II-XII: Pupils were equal round reactive to light. Extraocular movements were full, visual field were full on confrontational test. Facial sensation and strength were normal. Uvula tongue midline. Head turning and shoulder shrug  were normal and symmetric. Motor: The motor testing reveals 5 over 5 strength of all 4 extremities. Good symmetric motor tone is noted throughout.  Sensory: Sensory testing is intact to soft touch on all 4 extremities. No evidence of extinction is noted.  Coordination: Cerebellar testing reveals good finger-nose-finger and heel-to-shin bilaterally.  Gait and station: Gait is normal. Tandem gait is normal. Romberg is negative. No drift is seen.  Reflexes: Deep tendon reflexes are symmetric and normal bilaterally.    DIAGNOSTIC DATA (LABS, IMAGING, TESTING) - I reviewed patient records, labs, notes, testing and imaging myself where available.  Lab Results  Component Value Date   WBC 15.3 (H) 07/12/2022   HGB 14.1 07/12/2022   HCT 42.6 07/12/2022   MCV 89 07/12/2022      Component Value Date/Time   NA 141 07/12/2022 1505   K 4.4 07/12/2022 1505   CL 101 07/12/2022 1505   CO2 22 07/12/2022 1505   GLUCOSE 89 07/12/2022 1505   BUN 13 07/12/2022 1505   CREATININE 0.83 07/12/2022 1505   CALCIUM 9.8 07/12/2022 1505   PROT 7.3 07/12/2022 1505   ALBUMIN 4.4 07/12/2022 1505   AST 15 07/12/2022 1505   ALT 14 07/12/2022 1505   ALKPHOS 117 07/12/2022 1505   BILITOT 0.6 07/12/2022 1505   No results found for: "CHOL", "HDL", "LDLCALC", "LDLDIRECT", "TRIG", "CHOLHDL" Lab Results   Component Value Date   HGBA1C 5.7 (H) 07/12/2022   No results found for: "VITAMINB12" Lab Results  Component Value Date   TSH 1.690 07/12/2022        No data to display               No data to display           ASSESSMENT AND PLAN  71 y.o. year old female  has a past medical history of Diverticulitis. here with    No diagnosis found.  Alice Rieger ***.  Healthy lifestyle habits encouraged. *** will follow up with PCP as directed. *** will return to see me in ***, sooner if needed. *** verbalizes understanding and agreement with this plan.   No orders of the defined types were placed in this encounter.  No orders of the defined types were placed in this encounter.    Debbora Presto, MSN, FNP-C 09/05/2022, 1:50 PM  [facility] [address], [place],  [address] [phone]

## 2022-09-06 ENCOUNTER — Ambulatory Visit (INDEPENDENT_AMBULATORY_CARE_PROVIDER_SITE_OTHER): Payer: Medicare Other | Admitting: Family Medicine

## 2022-09-06 ENCOUNTER — Encounter: Payer: Self-pay | Admitting: Family Medicine

## 2022-09-06 VITALS — BP 144/88 | HR 81 | Ht 61.0 in | Wt 203.5 lb

## 2022-09-06 DIAGNOSIS — R202 Paresthesia of skin: Secondary | ICD-10-CM | POA: Diagnosis not present

## 2022-09-06 DIAGNOSIS — G9339 Other post infection and related fatigue syndromes: Secondary | ICD-10-CM | POA: Diagnosis not present

## 2022-09-06 DIAGNOSIS — R519 Headache, unspecified: Secondary | ICD-10-CM | POA: Diagnosis not present

## 2022-09-06 NOTE — Patient Instructions (Signed)
Below is our plan:  We will continue to monitor. We will discontinue gabapentin since you have not started. Discuss continuing oxcarbazepine with PCP.   Please make sure you are staying well hydrated. I recommend 50-60 ounces daily. Well balanced diet and regular exercise encouraged. Consistent sleep schedule with 6-8 hours recommended.   Please continue follow up with care team as directed.   Follow up with neurology as needed    You may receive a survey regarding today's visit. I encourage you to leave honest feed back as I do use this information to improve patient care. Thank you for seeing me today!

## 2022-09-08 DIAGNOSIS — Z23 Encounter for immunization: Secondary | ICD-10-CM | POA: Diagnosis not present

## 2022-09-19 DIAGNOSIS — E559 Vitamin D deficiency, unspecified: Secondary | ICD-10-CM | POA: Diagnosis not present

## 2022-09-19 DIAGNOSIS — R8290 Unspecified abnormal findings in urine: Secondary | ICD-10-CM | POA: Diagnosis not present

## 2022-09-19 DIAGNOSIS — I10 Essential (primary) hypertension: Secondary | ICD-10-CM | POA: Diagnosis not present

## 2022-09-19 DIAGNOSIS — D692 Other nonthrombocytopenic purpura: Secondary | ICD-10-CM | POA: Diagnosis not present

## 2022-09-19 DIAGNOSIS — J45909 Unspecified asthma, uncomplicated: Secondary | ICD-10-CM | POA: Diagnosis not present

## 2022-09-19 DIAGNOSIS — E7800 Pure hypercholesterolemia, unspecified: Secondary | ICD-10-CM | POA: Diagnosis not present

## 2022-09-19 DIAGNOSIS — D649 Anemia, unspecified: Secondary | ICD-10-CM | POA: Diagnosis not present

## 2022-09-19 DIAGNOSIS — Z6837 Body mass index (BMI) 37.0-37.9, adult: Secondary | ICD-10-CM | POA: Diagnosis not present

## 2022-09-20 DIAGNOSIS — J439 Emphysema, unspecified: Secondary | ICD-10-CM | POA: Diagnosis not present

## 2022-09-20 DIAGNOSIS — R0781 Pleurodynia: Secondary | ICD-10-CM | POA: Diagnosis not present

## 2022-09-25 DIAGNOSIS — S20211A Contusion of right front wall of thorax, initial encounter: Secondary | ICD-10-CM | POA: Diagnosis not present

## 2022-10-03 ENCOUNTER — Telehealth: Payer: Self-pay | Admitting: Neurology

## 2022-10-03 DIAGNOSIS — R7309 Other abnormal glucose: Secondary | ICD-10-CM

## 2022-10-03 NOTE — Telephone Encounter (Signed)
Patient called regarding recent charge $66 from [facility] on labs Dr. Krista Blue ordered, specifically the A1C -(denied) [facility] stated the order needed to state reason why (overweight, diabetes history, etc.) Could this be resubmitted? Patient's best call back [phone]

## 2022-10-05 NOTE — Telephone Encounter (Signed)
R 73.09, please add on abnormal glucose as diagnostic code for her A1C lab

## 2023-02-18 DIAGNOSIS — Z9181 History of falling: Secondary | ICD-10-CM | POA: Diagnosis not present

## 2023-02-18 DIAGNOSIS — Z131 Encounter for screening for diabetes mellitus: Secondary | ICD-10-CM | POA: Diagnosis not present

## 2023-02-18 DIAGNOSIS — E7800 Pure hypercholesterolemia, unspecified: Secondary | ICD-10-CM | POA: Diagnosis not present

## 2023-02-18 DIAGNOSIS — G500 Trigeminal neuralgia: Secondary | ICD-10-CM | POA: Diagnosis not present

## 2023-02-18 DIAGNOSIS — Z6837 Body mass index (BMI) 37.0-37.9, adult: Secondary | ICD-10-CM | POA: Diagnosis not present

## 2023-02-18 DIAGNOSIS — I10 Essential (primary) hypertension: Secondary | ICD-10-CM | POA: Diagnosis not present

## 2023-02-18 DIAGNOSIS — E559 Vitamin D deficiency, unspecified: Secondary | ICD-10-CM | POA: Diagnosis not present

## 2023-02-18 DIAGNOSIS — D649 Anemia, unspecified: Secondary | ICD-10-CM | POA: Diagnosis not present

## 2023-02-18 DIAGNOSIS — R8290 Unspecified abnormal findings in urine: Secondary | ICD-10-CM | POA: Diagnosis not present

## 2023-02-18 DIAGNOSIS — J45909 Unspecified asthma, uncomplicated: Secondary | ICD-10-CM | POA: Diagnosis not present

## 2023-02-18 DIAGNOSIS — R252 Cramp and spasm: Secondary | ICD-10-CM | POA: Diagnosis not present

## 2023-02-18 DIAGNOSIS — Z139 Encounter for screening, unspecified: Secondary | ICD-10-CM | POA: Diagnosis not present

## 2023-02-18 DIAGNOSIS — D692 Other nonthrombocytopenic purpura: Secondary | ICD-10-CM | POA: Diagnosis not present

## 2023-02-24 DIAGNOSIS — M25561 Pain in right knee: Secondary | ICD-10-CM | POA: Diagnosis not present

## 2023-03-06 DIAGNOSIS — M1712 Unilateral primary osteoarthritis, left knee: Secondary | ICD-10-CM | POA: Diagnosis not present

## 2023-03-06 DIAGNOSIS — M1711 Unilateral primary osteoarthritis, right knee: Secondary | ICD-10-CM | POA: Diagnosis not present

## 2023-03-06 DIAGNOSIS — M25561 Pain in right knee: Secondary | ICD-10-CM | POA: Diagnosis not present

## 2023-03-27 DIAGNOSIS — M1711 Unilateral primary osteoarthritis, right knee: Secondary | ICD-10-CM | POA: Diagnosis not present

## 2023-04-18 DIAGNOSIS — F17210 Nicotine dependence, cigarettes, uncomplicated: Secondary | ICD-10-CM | POA: Diagnosis not present

## 2023-04-18 DIAGNOSIS — J432 Centrilobular emphysema: Secondary | ICD-10-CM | POA: Diagnosis not present

## 2023-04-18 DIAGNOSIS — R0609 Other forms of dyspnea: Secondary | ICD-10-CM | POA: Diagnosis not present

## 2023-04-18 DIAGNOSIS — G4734 Idiopathic sleep related nonobstructive alveolar hypoventilation: Secondary | ICD-10-CM | POA: Diagnosis not present

## 2023-04-18 DIAGNOSIS — J449 Chronic obstructive pulmonary disease, unspecified: Secondary | ICD-10-CM | POA: Diagnosis not present

## 2023-04-18 DIAGNOSIS — E669 Obesity, unspecified: Secondary | ICD-10-CM | POA: Insufficient documentation

## 2023-05-14 DIAGNOSIS — M1711 Unilateral primary osteoarthritis, right knee: Secondary | ICD-10-CM | POA: Diagnosis not present

## 2023-05-22 DIAGNOSIS — J441 Chronic obstructive pulmonary disease with (acute) exacerbation: Secondary | ICD-10-CM | POA: Diagnosis not present

## 2023-05-22 DIAGNOSIS — Z6838 Body mass index (BMI) 38.0-38.9, adult: Secondary | ICD-10-CM | POA: Diagnosis not present

## 2023-06-24 DIAGNOSIS — G500 Trigeminal neuralgia: Secondary | ICD-10-CM | POA: Diagnosis not present

## 2023-06-24 DIAGNOSIS — R413 Other amnesia: Secondary | ICD-10-CM | POA: Diagnosis not present

## 2023-06-24 DIAGNOSIS — E559 Vitamin D deficiency, unspecified: Secondary | ICD-10-CM | POA: Diagnosis not present

## 2023-06-24 DIAGNOSIS — Z6838 Body mass index (BMI) 38.0-38.9, adult: Secondary | ICD-10-CM | POA: Diagnosis not present

## 2023-06-24 DIAGNOSIS — J449 Chronic obstructive pulmonary disease, unspecified: Secondary | ICD-10-CM | POA: Diagnosis not present

## 2023-06-24 DIAGNOSIS — E538 Deficiency of other specified B group vitamins: Secondary | ICD-10-CM | POA: Diagnosis not present

## 2023-06-24 DIAGNOSIS — I10 Essential (primary) hypertension: Secondary | ICD-10-CM | POA: Diagnosis not present

## 2023-06-24 DIAGNOSIS — F3341 Major depressive disorder, recurrent, in partial remission: Secondary | ICD-10-CM | POA: Diagnosis not present

## 2023-06-24 DIAGNOSIS — E782 Mixed hyperlipidemia: Secondary | ICD-10-CM | POA: Diagnosis not present

## 2023-06-28 ENCOUNTER — Other Ambulatory Visit: Payer: Self-pay

## 2023-06-28 DIAGNOSIS — K5792 Diverticulitis of intestine, part unspecified, without perforation or abscess without bleeding: Secondary | ICD-10-CM | POA: Insufficient documentation

## 2023-06-28 DIAGNOSIS — G500 Trigeminal neuralgia: Secondary | ICD-10-CM | POA: Insufficient documentation

## 2023-06-29 NOTE — Progress Notes (Deleted)
Cardiology Office Note:    Date:  06/29/2023   ID:  Holly Hardy, DOB 31-May-1951, MRN [id number]  PCP:  Rolm Gala, NP  Cardiologist:  Norman Herrlich, MD   Referring MD: Rolm Gala, NP  ASSESSMENT:    No diagnosis found. PLAN:    In order of problems listed above:  ***  Next appointment   Medication Adjustments/Labs and Tests Ordered: Current medicines are reviewed at length with the patient today.  Concerns regarding medicines are outlined above.  No orders of the defined types were placed in this encounter.  No orders of the defined types were placed in this encounter.    No chief complaint on file. ***  History of Present Illness:    Holly Hardy is a 72 y.o. female with a history of COPD and emphysema who is being seen today for the evaluation of chest pain at the request of Rolm Gala, NP. Review shows no previous cardiac evaluation or diagnostic cardiac imaging. Chest CT lung cancer screen 03/29/2022 showing aortic and coronary artery calcification and atherosclerosis. Past Medical History:  Diagnosis Date   Calculus of gallbladder with chronic cholecystitis without obstruction 01/29/2019   Cancer of right kidney (HCC) 08/10/2021   Last Assessment & Plan:   Formatting of this note might be different from the original.  [name] is a 72 year old female who underwent a right partial nephrectomy 05/15/2018, ccRCC, pT1bNxR0, FG 3. NED. Stable BMP. RTC in 12 months with CXR, CT abd, and BMP AOT.     Centrilobular emphysema (HCC) 10/12/2019   Formatting of this note might be different from the original.  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  6-minute walk test 10/12/2019 did not show desaturations  ONO 05/13/20 desaturation <88% 3 minute 16 seconds does not diagnose Nocturnal Hypoxia     Facial pain  07/12/2022   History of 2019 novel coronavirus disease (COVID-19) 10/31/2020   Patient reports: 10/08/2020     Nocturnal hypoxia 05/13/2020   Formatting of this note might be different from the original.  Overnight oximetry test 12/20/2020 shows desaturation below 88% 1 hour 3 minutes 20 seconds     Obesity 04/18/2023   This Problem was set by a rule (CHS_EKS_BMI_PROB).     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  PFT 03/29/2021 ratio 71% FEV1 81% FVC 113% DLCO 59%  PFT     10/20/19 ratio 65% FEV1 64% FVC   76% DLCO 73%     Trigeminal neuralgia     Past Surgical History:  Procedure Laterality Date   NEPHRECTOMY RADICAL      Current Medications: No outpatient medications have been marked as taking for the 07/01/23 encounter (Appointment) with Baldo Daub, MD.     Allergies:   Codeine and Meperidine   Social History   Socioeconomic History   Marital status: Married    Spouse name: Not on file   Number of children: Not on file   Years of education: Not on file   Highest education level: Not on file  Occupational History   Not on file  Tobacco  Use   Smoking status: Former    Current packs/day: 0.00    Types: Cigarettes    Quit date: 2020    Years since quitting: 4.7   Smokeless tobacco: Never  Substance and Sexual Activity   Alcohol use: Not Currently   Drug use: Not Currently    Comment: hx of cocaine use   Sexual activity: Not on file  Other Topics Concern   Not on file  Social History Narrative   Not on file   Social Determinants of Health   Financial Resource Strain: Not on file  Food Insecurity: Not on file  Transportation Needs: Not on file  Physical Activity: Not on file  Stress: Not on file  Social Connections: Not on file      Family History: The patient's ***family history includes Breast cancer in her maternal aunt; Kidney failure in her mother; Liver cancer in her father.  ROS:   ROS Please see the history of present illness.    *** All other systems reviewed and are negative.  EKGs/Labs/Other Studies Reviewed:    The following studies were reviewed today: ***  Cardiac Studies & Procedures         MONITORS  LONG TERM MONITOR (3-14 DAYS) 06/17/2019  Narrative A ZIO monitor was performed for 3 days and 2 hours beginning 06/06/2019 to evaluate bradycardia.  The rhythm throughout was sinus with minimum, average and maximum heart rates of 50, 75 and 143 bpm.  There were no pauses of 3 seconds or greater and no episodes of AV nodal or sinus node block.  Ventricular ectopy was rare with isolated PVCs and couplets.  Upper ventricular arrhythmia was rare with 1 7 beat run of atrial premature contractions, paroxysmal atrial tachycardia, at a rate of 185 bpm.  This episode was asymptomatic.  There were no episodes of atrial fibrillation or flutter.  There were 16 triggered events all associated with sinus rhythm   Conclusion, unremarkable event monitor, the brief runs of atrial premature contractions was unassociated with symptoms and the triggered events were unassociated with arrhythmia.           EKG:  EKG is *** ordered today.  The ekg ordered today is personally reviewed and demonstrates ***  Recent Labs: 07/12/2022: ALT 14; BUN 13; Creatinine, Ser 0.83; Hemoglobin 14.1; Potassium 4.4; Sodium 141; TSH 1.690  Recent Lipid Panel No results found for: "CHOL", "TRIG", "HDL", "CHOLHDL", "VLDL", "LDLCALC", "LDLDIRECT"  Physical Exam:    VS:  There were no vitals taken for this visit.    Wt Readings from Last 3 Encounters:  09/06/22 203 lb 8 oz (92.3 kg)  07/12/22 199 lb (90.3 kg)     GEN: *** Well nourished, well developed in no acute distress HEENT: Normal NECK: No JVD; No carotid  bruits LYMPHATICS: No lymphadenopathy CARDIAC: ***RRR, no murmurs, rubs, gallops RESPIRATORY:  Clear to auscultation without rales, wheezing or rhonchi  ABDOMEN: Soft, non-tender, non-distended MUSCULOSKELETAL:  No edema; No deformity  SKIN: Warm and dry NEUROLOGIC:  Alert and oriented x 3 PSYCHIATRIC:  Normal affect     Signed, Norman Herrlich, MD  06/29/2023 5:09 PM    [facility]

## 2023-06-29 NOTE — Progress Notes (Unsigned)
Cardiology Office Note:    Date:  07/01/2023   ID:  Holly Hardy, DOB [birth year], MRN [id number]  PCP:  Gus Height, PA Cardiologist:  Norman Herrlich, MD   Referring MD: Rolm Gala, NP  ASSESSMENT:    1. Chest pain of uncertain etiology   2. SOB (shortness of breath) on exertion   3. Centrilobular emphysema (HCC)   4. Coronary artery calcification seen on CT scan   5. Precordial pain    PLAN:    In order of problems listed above:  Further evaluation cardiac CTA with her chronic chest pain pattern and coronary artery calcification Echocardiogram to assess for pulmonary hypertension and findings of systolic and diastolic function She is taking valsartan for hypertension continue the same Is taking rosuvastatin for hyperlipidemia continue the same  Next appointment   Medication Adjustments/Labs and Tests Ordered: Current medicines are reviewed at length with the patient today.  Concerns regarding medicines are outlined above.  Orders Placed This Encounter  Procedures   EKG 12-Lead   No orders of the defined types were placed in this encounter.     History of Present Illness:    Holly Hardy is a 72 y.o. female with a history of COPD emphysema renal cell cancer and right partial nephrectomy August 2019 who is being seen today for the evaluation of chest pain at the request of Rolm Gala, NP. Chart review shows no previous cardiology evaluation or diagnostic cardiac testing however lung CT for cancer screening June 2023 showed coronary and aortic atherosclerosis.  Her concern is that she thinks she may have underlying heart disease She is concerned about her severe shortness of breath she has to stop rest with ADLs and she struggles with light activities and even walking to her mailbox Her oxygen saturations are low at nighttime and uses nocturnal oxygen She has no edema orthopnea She tells me 25 years ago she had a normal heart catheterization She is aware she  has atherosclerosis on her CT scan She has a chronic chest pain pattern for 6 months that occurs 1 or 2 times a week it can last all day and she describes as a dull ache in the left chest not exertional not relieved with rest not pleuritic not positional in nature He does have a history of a fall and fracturing a rib in the right chest She has no known history of heart disease congenital rheumatic or atrial fibrillation  Past Medical History:  Diagnosis Date   Calculus of gallbladder with chronic cholecystitis without obstruction 01/29/2019   Cancer of right kidney (HCC) 08/10/2021   Last Assessment & Plan:   Formatting of this note might be different from the original.  [name] is a 72 year old female who underwent a right partial nephrectomy 05/15/2018, ccRCC, pT1bNxR0, FG 3. NED. Stable BMP. RTC in 12 months with CXR, CT abd, and BMP AOT.     Centrilobular emphysema (HCC) 10/12/2019   Formatting of this note might be different from the original.  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  6-minute walk test 10/12/2019 did not show desaturations  ONO 05/13/20 desaturation <88% 3 minute 16 seconds does not diagnose Nocturnal Hypoxia     Facial pain 07/12/2022   History of 2019 novel coronavirus disease (COVID-19) 10/31/2020   Patient reports: 10/08/2020  Nocturnal hypoxia 05/13/2020   Formatting of this note might be different from the original.  Overnight oximetry test 12/20/2020 shows desaturation below 88% 1 hour 3 minutes 20 seconds     Obesity 04/18/2023   This Problem was set by a rule (CHS_EKS_BMI_PROB).     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  PFT 03/29/2021 ratio 71% FEV1 81% FVC 113% DLCO 59%  PFT     10/20/19 ratio 65% FEV1 64% FVC   76% DLCO 73%     Trigeminal neuralgia     Past Surgical History:  Procedure Laterality Date   NEPHRECTOMY RADICAL      Current Medications: Current Meds  Medication Sig   cyanocobalamin (VITAMIN B12) 1000 MCG/ML injection Inject 1,000 mcg into the muscle once a week.   ergocalciferol (VITAMIN D2) 1.25 MG (50000 UT) capsule Take 50,000 Units by mouth once a week.   ipratropium-albuterol (DUONEB) 0.5-2.5 (3) MG/3ML SOLN Take 3 mLs by nebulization as needed (wheezing or shortness of breath).   rosuvastatin (CRESTOR) 5 MG tablet Take 5 mg by mouth daily.   sertraline (ZOLOFT) 25 MG tablet Take 25 mg by mouth daily.   tiotropium (SPIRIVA) 18 MCG inhalation capsule Place 18 mcg into inhaler and inhale daily.   valsartan (DIOVAN) 160 MG tablet Take 1 tablet by mouth daily.   VENTOLIN HFA 108 (90 Base) MCG/ACT inhaler Inhale 2 puffs into the lungs every 6 (six) hours as needed for wheezing or shortness of breath.     Allergies:   Codeine and Meperidine   Social History   Socioeconomic History   Marital status: Married    Spouse name: Not on file   Number of children: Not on file   Years of education: Not on file   Highest education level: Not on file  Occupational History   Not on file  Tobacco Use   Smoking status: Former    Current packs/day: 0.00    Types: Cigarettes    Quit date: 2020    Years since quitting: 4.7   Smokeless tobacco: Never  Substance and Sexual Activity   Alcohol use: Not Currently   Drug use: Not Currently    Comment: hx of cocaine use   Sexual activity: Not on file  Other Topics Concern   Not on file  Social History Narrative   Not on file   Social Determinants of Health   Financial Resource Strain: Not on file  Food Insecurity: Not on file  Transportation Needs: Not on file  Physical Activity: Not on file   Stress: Not on file  Social Connections: Not on file     Family History: The patient's family history includes Breast cancer in her maternal aunt; Kidney failure in her mother; Liver cancer in her father.  ROS:   ROS Please see the history of present illness.     All other systems reviewed and are negative.  EKGs/Labs/Other Studies Reviewed:    The following studies were reviewed today:   Cardiac Studies & Procedures         MONITORS  LONG TERM MONITOR (3-14 DAYS) 06/17/2019  Narrative A ZIO monitor was performed for 3 days and 2 hours beginning 06/06/2019 to evaluate bradycardia.  The rhythm throughout was sinus with minimum, average and  maximum heart rates of 50, 75 and 143 bpm.  There were no pauses of 3 seconds or greater and no episodes of AV nodal or sinus node block.  Ventricular ectopy was rare with isolated PVCs and couplets.  Upper ventricular arrhythmia was rare with 1 7 beat run of atrial premature contractions, paroxysmal atrial tachycardia, at a rate of 185 bpm.  This episode was asymptomatic.  There were no episodes of atrial fibrillation or flutter.  There were 16 triggered events all associated with sinus rhythm   Conclusion, unremarkable event monitor, the brief runs of atrial premature contractions was unassociated with symptoms and the triggered events were unassociated with arrhythmia.           EKG Interpretation Date/Time:  Monday July 01 2023 10:41:31 EDT Ventricular Rate:  86 PR Interval:  130 QRS Duration:  86 QT Interval:  340 QTC Calculation: 406 R Axis:   28  Text Interpretation: Normal sinus rhythm with sinus arrhythmia Normal ECG No previous ECGs available Confirmed by Norman Herrlich ([id number]) on 07/01/2023 10:47:07 AM ,  Recent Labs: 07/12/2022: ALT 14; BUN 13; Creatinine, Ser 0.83; Hemoglobin 14.1; Potassium 4.4; Sodium 141; TSH 1.690  Recent Lipid Panel Open profile 06/24/2023 cholesterol 209 LDL 122 A1c 5.6 hemoglobin 14.7  creatinine 0.99  Physical Exam:    VS:  BP 134/84   Pulse 86   Ht 5\' 1"  (1.549 m)   Wt 210 lb (95.3 kg)   SpO2 94%   BMI 39.68 kg/m     Wt Readings from Last 3 Encounters:  07/01/23 210 lb (95.3 kg)  09/06/22 203 lb 8 oz (92.3 kg)  07/12/22 199 lb (90.3 kg)     GEN:  Well nourished, well developed in no acute distress HEENT: Normal NECK: No JVD; No carotid bruits LYMPHATICS: No lymphadenopathy CARDIAC: RRR, no murmurs, rubs, gallops RESPIRATORY:  Clear to auscultation without rales, wheezing or rhonchi  ABDOMEN: Soft, non-tender, non-distended MUSCULOSKELETAL:  No edema; No deformity  SKIN: Warm and dry NEUROLOGIC:  Alert and oriented x 3 PSYCHIATRIC:  Normal affect     Signed, Norman Herrlich, MD  07/01/2023 11:03 AM    [facility]

## 2023-07-01 ENCOUNTER — Ambulatory Visit: Payer: Medicare Other | Attending: Cardiology | Admitting: Cardiology

## 2023-07-01 ENCOUNTER — Encounter: Payer: Self-pay | Admitting: Cardiology

## 2023-07-01 VITALS — BP 134/84 | HR 86 | Ht 61.0 in | Wt 210.0 lb

## 2023-07-01 DIAGNOSIS — I2510 Atherosclerotic heart disease of native coronary artery without angina pectoris: Secondary | ICD-10-CM

## 2023-07-01 DIAGNOSIS — R072 Precordial pain: Secondary | ICD-10-CM

## 2023-07-01 DIAGNOSIS — R079 Chest pain, unspecified: Secondary | ICD-10-CM | POA: Diagnosis not present

## 2023-07-01 DIAGNOSIS — R0602 Shortness of breath: Secondary | ICD-10-CM | POA: Diagnosis not present

## 2023-07-01 DIAGNOSIS — J432 Centrilobular emphysema: Secondary | ICD-10-CM | POA: Diagnosis not present

## 2023-07-01 MED ORDER — METOPROLOL TARTRATE 100 MG PO TABS
100.0000 mg | ORAL_TABLET | Freq: Once | ORAL | 0 refills | Status: DC
Start: 2023-07-01 — End: 2023-07-17

## 2023-07-01 NOTE — Patient Instructions (Addendum)
Medication Instructions:  Your physician recommends that you continue on your current medications as directed. Please refer to the Current Medication list given to you today.  *If you need a refill on your cardiac medications before your next appointment, please call your pharmacy*   Lab Work: Your physician recommends that you return for lab work in:   Labs today: BMP  If you have labs (blood work) drawn today and your tests are completely normal, you will receive your results only by: MyChart Message (if you have MyChart) OR A paper copy in the mail If you have any lab test that is abnormal or we need to change your treatment, we will call you to review the results.   Testing/Procedures: Your physician has requested that you have an echocardiogram. Echocardiography is a painless test that uses sound waves to create images of your heart. It provides your doctor with information about the size and shape of your heart and how well your heart's chambers and valves are working. This procedure takes approximately one hour. There are no restrictions for this procedure. Please do NOT wear cologne, perfume, aftershave, or lotions (deodorant is allowed). Please arrive 15 minutes prior to your appointment time.    Your cardiac CT will be scheduled at one of the below locations:   [facility] [address] [place], [place] [address] [phone]  OR  [facility] [address] [place], [place] [address] [phone]  OR   [facility] [address] [place], [place] [address] [phone]  If scheduled at [facility], please arrive at the [facility] and Children's Entrance (Entrance C2) of [facility] 30 minutes prior to test start time. You can use the FREE valet parking offered at entrance C (encouraged to control the heart rate for the test)  Proceed to the [facility] Radiology  Department (first floor) to check-in and test prep.  All radiology patients and guests should use entrance C2 at [facility], accessed from [facility], even though the hospital's physical address listed is [address]    If scheduled at [facility] or [facility], please arrive 15 mins early for check-in and test prep.  There is spacious parking and easy access to the radiology department from the [facility] entrance. Please enter here and check-in with the desk attendant.   Please follow these instructions carefully (unless otherwise directed):  An IV will be required for this test and Nitroglycerin will be given.  Hold all erectile dysfunction medications at least 3 days (72 hrs) prior to test. (Ie viagra, cialis, sildenafil, tadalafil, etc)   On the Night Before the Test: Be sure to Drink plenty of water. Do not consume any caffeinated/decaffeinated beverages or chocolate 12 hours prior to your test. Do not take any antihistamines 12 hours prior to your test   On the Day of the Test: Drink plenty of water until 1 hour prior to the test. Do not eat any food 1 hour prior to test. You may take your regular medications prior to the test.  Take metoprolol (Lopressor) two hours prior to test. FEMALES- please wear underwire-free bra if available, avoid dresses & tight clothing     After the Test: Drink plenty of water. After receiving IV contrast, you may experience a mild flushed feeling. This is normal. On occasion, you may experience a mild rash up to 24  hours after the test. This is not dangerous. If this occurs, you can take Benadryl 25 mg and increase your fluid intake. If you experience trouble breathing, this can be serious. If it is severe call 911 IMMEDIATELY. If it is mild, please call our office.  We will call to schedule your test 2-4 weeks out understanding that some insurance  companies will need an authorization prior to the service being performed.   For more information and frequently asked questions, please visit our website : [email or web address]  For non-scheduling related questions, please contact the cardiac imaging nurse navigator should you have any questions/concerns: Cardiac Imaging Nurse Navigators Direct Office Dial: [phone]   For scheduling needs, including cancellations and rescheduling, please call [place], [phone].    Follow-Up: At [facility], you and your health needs are our priority.  As part of our continuing mission to provide you with exceptional heart care, we have created designated Provider Care Teams.  These Care Teams include your primary Cardiologist (physician) and Advanced Practice Providers (APPs -  Physician Assistants and Nurse Practitioners) who all work together to provide you with the care you need, when you need it.  We recommend signing up for the patient portal called "MyChart".  Sign up information is provided on this After Visit Summary.  MyChart is used to connect with patients for Virtual Visits (Telemedicine).  Patients are able to view lab/test results, encounter notes, upcoming appointments, etc.  Non-urgent messages can be sent to your provider as well.   To learn more about what you can do with MyChart, go to [email or web address].    Your next appointment:   3 month(s)  Provider:    Norman Herrlich, MD   Other Instructions None

## 2023-07-02 LAB — BASIC METABOLIC PANEL
BUN/Creatinine Ratio: 14 (ref 12–28)
BUN: 13 mg/dL (ref 8–27)
CO2: 27 mmol/L (ref 20–29)
Calcium: 9.2 mg/dL (ref 8.7–10.3)
Chloride: 104 mmol/L (ref 96–106)
Creatinine, Ser: 0.93 mg/dL (ref 0.57–1.00)
Glucose: 80 mg/dL (ref 70–99)
Potassium: 4.5 mmol/L (ref 3.5–5.2)
Sodium: 142 mmol/L (ref 134–144)
eGFR: 65 mL/min/{1.73_m2} (ref >59)

## 2023-07-09 ENCOUNTER — Encounter (HOSPITAL_COMMUNITY): Payer: Self-pay

## 2023-07-11 ENCOUNTER — Ambulatory Visit (HOSPITAL_COMMUNITY): Payer: Medicare Other

## 2023-07-17 ENCOUNTER — Other Ambulatory Visit (HOSPITAL_COMMUNITY): Payer: Self-pay | Admitting: *Deleted

## 2023-07-17 ENCOUNTER — Ambulatory Visit (HOSPITAL_COMMUNITY): Admission: RE | Admit: 2023-07-17 | Payer: Medicare Other | Source: Ambulatory Visit

## 2023-07-17 MED ORDER — METOPROLOL TARTRATE 100 MG PO TABS: 100.0000 mg | ORAL_TABLET | Freq: Once | ORAL | 0 refills | Status: AC

## 2023-07-22 DIAGNOSIS — R42 Dizziness and giddiness: Secondary | ICD-10-CM | POA: Diagnosis not present

## 2023-07-22 DIAGNOSIS — H9202 Otalgia, left ear: Secondary | ICD-10-CM | POA: Diagnosis not present

## 2023-07-26 ENCOUNTER — Encounter (HOSPITAL_COMMUNITY): Payer: Self-pay

## 2023-07-29 ENCOUNTER — Telehealth (HOSPITAL_COMMUNITY): Payer: Self-pay | Admitting: *Deleted

## 2023-07-29 DIAGNOSIS — Z87891 Personal history of nicotine dependence: Secondary | ICD-10-CM | POA: Diagnosis not present

## 2023-07-29 DIAGNOSIS — I700 Atherosclerosis of aorta: Secondary | ICD-10-CM | POA: Diagnosis not present

## 2023-07-29 DIAGNOSIS — K689 Other disorders of retroperitoneum: Secondary | ICD-10-CM | POA: Diagnosis not present

## 2023-07-29 DIAGNOSIS — Z122 Encounter for screening for malignant neoplasm of respiratory organs: Secondary | ICD-10-CM | POA: Diagnosis not present

## 2023-07-29 DIAGNOSIS — J439 Emphysema, unspecified: Secondary | ICD-10-CM | POA: Diagnosis not present

## 2023-07-29 DIAGNOSIS — Z9889 Other specified postprocedural states: Secondary | ICD-10-CM | POA: Diagnosis not present

## 2023-07-29 DIAGNOSIS — R590 Localized enlarged lymph nodes: Secondary | ICD-10-CM | POA: Diagnosis not present

## 2023-07-29 DIAGNOSIS — C641 Malignant neoplasm of right kidney, except renal pelvis: Secondary | ICD-10-CM | POA: Diagnosis not present

## 2023-07-29 DIAGNOSIS — N289 Disorder of kidney and ureter, unspecified: Secondary | ICD-10-CM | POA: Diagnosis not present

## 2023-07-29 DIAGNOSIS — Z85528 Personal history of other malignant neoplasm of kidney: Secondary | ICD-10-CM | POA: Diagnosis not present

## 2023-07-29 DIAGNOSIS — F17210 Nicotine dependence, cigarettes, uncomplicated: Secondary | ICD-10-CM | POA: Diagnosis not present

## 2023-07-29 DIAGNOSIS — R918 Other nonspecific abnormal finding of lung field: Secondary | ICD-10-CM | POA: Diagnosis not present

## 2023-07-29 DIAGNOSIS — N2889 Other specified disorders of kidney and ureter: Secondary | ICD-10-CM | POA: Diagnosis not present

## 2023-07-29 DIAGNOSIS — I2510 Atherosclerotic heart disease of native coronary artery without angina pectoris: Secondary | ICD-10-CM | POA: Diagnosis not present

## 2023-07-29 NOTE — Telephone Encounter (Signed)
Attempted to call patient regarding upcoming cardiac CT appointment. Left message on voicemail with name and callback number Hayley Sharpe RN Navigator Cardiac Imaging [facility] [phone] Office   

## 2023-07-30 ENCOUNTER — Ambulatory Visit (HOSPITAL_COMMUNITY)
Admission: RE | Admit: 2023-07-30 | Discharge: 2023-07-30 | Disposition: A | Discharge status: Home / Self Care | Payer: Medicare Other | Source: Ambulatory Visit | Attending: Cardiology | Admitting: Cardiology

## 2023-07-30 DIAGNOSIS — R072 Precordial pain: Secondary | ICD-10-CM | POA: Diagnosis not present

## 2023-07-30 MED ORDER — IOHEXOL 350 MG/ML SOLN
100.0000 mL | Freq: Once | INTRAVENOUS | Status: AC | PRN
  Administered 2023-07-30: 100 mL via INTRAVENOUS

## 2023-07-30 MED ORDER — DILTIAZEM HCL 25 MG/5ML IV SOLN
INTRAVENOUS | Status: AC
Start: 2023-07-30 — End: ?
  Filled 2023-07-30: qty 5

## 2023-07-30 MED ORDER — NITROGLYCERIN 0.4 MG SL SUBL
0.8000 mg | SUBLINGUAL_TABLET | Freq: Once | SUBLINGUAL | Status: AC
  Administered 2023-07-30: 0.8 mg via SUBLINGUAL

## 2023-07-30 MED ORDER — NITROGLYCERIN 0.4 MG SL SUBL
SUBLINGUAL_TABLET | SUBLINGUAL | Status: AC
  Filled 2023-07-30: qty 2

## 2023-07-31 ENCOUNTER — Encounter: Payer: Self-pay | Admitting: Cardiology

## 2023-08-01 ENCOUNTER — Other Ambulatory Visit: Payer: Self-pay

## 2023-08-01 MED ORDER — ROSUVASTATIN CALCIUM 10 MG PO TABS: 10.0000 mg | ORAL_TABLET | Freq: Every day | ORAL | 3 refills | Status: AC

## 2023-08-06 DIAGNOSIS — D72829 Elevated white blood cell count, unspecified: Secondary | ICD-10-CM | POA: Diagnosis not present

## 2023-08-07 ENCOUNTER — Encounter: Payer: Self-pay | Admitting: Cardiology

## 2023-08-07 ENCOUNTER — Ambulatory Visit: Payer: Medicare Other | Attending: Cardiology

## 2023-08-07 DIAGNOSIS — R072 Precordial pain: Secondary | ICD-10-CM | POA: Diagnosis not present

## 2023-08-07 DIAGNOSIS — I2510 Atherosclerotic heart disease of native coronary artery without angina pectoris: Secondary | ICD-10-CM | POA: Insufficient documentation

## 2023-08-07 DIAGNOSIS — R079 Chest pain, unspecified: Secondary | ICD-10-CM | POA: Diagnosis not present

## 2023-08-07 DIAGNOSIS — R0602 Shortness of breath: Secondary | ICD-10-CM | POA: Insufficient documentation

## 2023-08-07 DIAGNOSIS — J432 Centrilobular emphysema: Secondary | ICD-10-CM | POA: Insufficient documentation

## 2023-08-07 LAB — ECHOCARDIOGRAM COMPLETE
AV Mean grad: 9 mm[Hg]
AV Peak grad: 15.8 mm[Hg]
Ao pk vel: 1.99 m/s
Area-P 1/2: 3.12 cm2
MV M vel: 3.46 m/s
MV Peak grad: 47.9 mm[Hg]
S' Lateral: 2.5 cm

## 2023-08-20 DIAGNOSIS — C641 Malignant neoplasm of right kidney, except renal pelvis: Secondary | ICD-10-CM | POA: Diagnosis not present

## 2023-08-20 DIAGNOSIS — N289 Disorder of kidney and ureter, unspecified: Secondary | ICD-10-CM | POA: Diagnosis not present

## 2023-08-21 ENCOUNTER — Other Ambulatory Visit: Payer: Self-pay | Admitting: Oncology

## 2023-08-21 DIAGNOSIS — D72829 Elevated white blood cell count, unspecified: Secondary | ICD-10-CM

## 2023-08-21 NOTE — Progress Notes (Signed)
[facility]  [address] [place],  [place]  [address] [phone]  Clinic Day:  08/22/2023  Referring physician: Gus Height, PA   HISTORY OF PRESENT ILLNESS:  The patient is a 72 y.o. female who I was asked to consult upon for progressive leukocytosis.  Her white count in late October 2024 was elevated at 14.9.  It was 12.5 in September 2024.  According to the patient, she has had an elevated white count for at least the past 6 years.  Recently, she was given antibiotics to treat respiratory infection.  She also recalls receiving a Kenalog injection 3 months ago she also recalls being placed on a prednisone taper for 4 days, which was approximately 6 weeks ago.  She denies having any B symptoms which concern her for leukocytosis being related to an underlying hematologic malignancy.  She also denies having undergone a previous splenectomy, which can also lead to chronic leukocytosis.  To her knowledge, there is no family history of leukocytosis or other hematologic disorders.  PAST MEDICAL HISTORY:   Past Medical History:  Diagnosis Date   Calculus of gallbladder with chronic cholecystitis without obstruction 01/29/2019   Cancer of right kidney (HCC) 08/10/2021   Last Assessment & Plan:   Formatting of this note might be different from the original.  Ms. Mcfeely is a 72 year old female who underwent a right partial nephrectomy 05/15/2018, ccRCC, pT1bNxR0, FG 3. NED. Stable BMP. RTC in 12 months with CXR, CT abd, and BMP AOT.     Centrilobular emphysema (HCC) 10/12/2019   Formatting of this note might be different from the original.  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  6-minute walk test 10/12/2019 did not show desaturations  ONO 05/13/20 desaturation <88% 3 minute 16 seconds does not diagnose  Nocturnal Hypoxia     Facial pain 07/12/2022   History of 2019 novel coronavirus disease (COVID-19) 10/31/2020   Patient reports: 10/08/2020     Nocturnal hypoxia 05/13/2020   Formatting of this note might be different from the original.  Overnight oximetry test 12/20/2020 shows desaturation below 88% 1 hour 3 minutes 20 seconds     Obesity 04/18/2023   This Problem was set by a rule (CHS_EKS_BMI_PROB).     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  PFT 03/29/2021 ratio 71% FEV1 81% FVC 113% DLCO 59%  PFT     10/20/19 ratio 65% FEV1 64% FVC   76% DLCO 73%     Trigeminal neuralgia     PAST SURGICAL HISTORY:   Past Surgical History:  Procedure Laterality Date   BUNIONECTOMY Bilateral    CHOLECYSTECTOMY     COLONOSCOPY W/ POLYPECTOMY     NASAL SEPTUM SURGERY     NASAL SINUS SURGERY     NEPHRECTOMY RADICAL     TONSILLECTOMY AND ADENOIDECTOMY      CURRENT MEDICATIONS:   Current Outpatient Medications  Medication Sig Dispense Refill   aspirin EC 81 MG tablet Take 1 tablet (81 mg total) by mouth daily. Swallow whole. 90 tablet 3   cyanocobalamin (VITAMIN B12) 1000  MCG/ML injection Inject 1,000 mcg into the muscle once a week.     ergocalciferol (VITAMIN D2) 1.25 MG (50000 UT) capsule Take 50,000 Units by mouth once a week.     ipratropium-albuterol (DUONEB) 0.5-2.5 (3) MG/3ML SOLN Take 3 mLs by nebulization as needed (wheezing or shortness of breath).     metoprolol tartrate (LOPRESSOR) 100 MG tablet Take 1 tablet (100 mg total) by mouth once for 1 dose. Please take this medication 2 hours before CT. 1 tablet 0   rosuvastatin (CRESTOR) 10 MG tablet Take 1 tablet (10 mg total) by mouth daily. 90 tablet 3   sertraline (ZOLOFT) 25 MG tablet Take 25 mg by mouth  daily.     tiotropium (SPIRIVA) 18 MCG inhalation capsule Place 18 mcg into inhaler and inhale daily.     valsartan (DIOVAN) 160 MG tablet Take 1 tablet by mouth daily.     VENTOLIN HFA 108 (90 Base) MCG/ACT inhaler Inhale 2 puffs into the lungs every 6 (six) hours as needed for wheezing or shortness of breath.     No current facility-administered medications for this visit.    ALLERGIES:   Allergies  Allergen Reactions   Codeine Swelling   Meperidine Itching and Swelling    FAMILY HISTORY:   Family History  Problem Relation Age of Onset   Kidney failure Mother    Liver cancer Father    Multiple myeloma Sister    Other Brother        SUDDEN INFANT DEATH   Other Brother        SUICIDE   Diabetes Brother        IN SNF   Breast cancer Maternal Aunt     SOCIAL HISTORY:  The patient was born and raised in [place].  She currently lives in the [place] community.  She is widowed, with 1 child and 1 grandchild.  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  She did smoke a half a pack of cigarettes daily for 23 years before quitting 4 years ago.  REVIEW OF SYSTEMS:  Review of Systems  Constitutional:  Negative for fatigue and fever.  HENT:   Negative for hearing loss and sore throat.   Eyes:  Positive for eye problems.  Respiratory:  Positive for shortness of breath. Negative for chest tightness, cough and hemoptysis.   Cardiovascular:  Positive for chest pain. Negative for palpitations.  Gastrointestinal:  Positive for constipation, diarrhea and nausea. Negative for abdominal distention, abdominal pain, blood in stool and vomiting.  Endocrine: Negative for hot flashes.  Genitourinary:  Negative for difficulty urinating, dysuria, frequency, hematuria and nocturia.   Musculoskeletal:  Positive for neck pain. Negative for arthralgias, back pain, gait problem and myalgias.  Skin: Negative.  Negative for itching and rash.  Neurological: Negative.  Negative for  dizziness, extremity weakness, gait problem, headaches, light-headedness and numbness.  Hematological: Negative.   Psychiatric/Behavioral:  Positive for depression. Negative for suicidal ideas. The patient is not nervous/anxious.      PHYSICAL EXAM:  Blood pressure (!) 148/95, pulse (!) 103, temperature 98.9 F (37.2 C), resp. rate 18, height 5\' 1"  (1.549 m), weight 212 lb 4.8 oz (96.3 kg), SpO2 94%. Wt Readings from Last 3 Encounters:  08/22/23 212 lb 4.8 oz (96.3 kg)  07/01/23 210 lb (95.3 kg)  09/06/22 203 lb 8 oz (92.3 kg)   Body mass index is 40.11 kg/m. Performance status (ECOG): 1 - Symptomatic but completely ambulatory Physical Exam Constitutional:  Appearance: Normal appearance. She is obese. She is not ill-appearing.  HENT:     Mouth/Throat:     Mouth: Mucous membranes are moist.     Pharynx: Oropharynx is clear. No oropharyngeal exudate or posterior oropharyngeal erythema.  Cardiovascular:     Rate and Rhythm: Normal rate and regular rhythm.     Heart sounds: No murmur heard.    No friction rub. No gallop.  Pulmonary:     Effort: Pulmonary effort is normal. No respiratory distress.     Breath sounds: Decreased breath sounds present. No wheezing, rhonchi or rales.  Abdominal:     General: Bowel sounds are normal. There is no distension.     Palpations: Abdomen is soft. There is no mass.     Tenderness: There is no abdominal tenderness.  Musculoskeletal:        General: No swelling.     Right lower leg: No edema.     Left lower leg: No edema.  Lymphadenopathy:     Cervical: No cervical adenopathy.     Upper Body:     Right upper body: No supraclavicular or axillary adenopathy.     Left upper body: No supraclavicular or axillary adenopathy.     Lower Body: No right inguinal adenopathy. No left inguinal adenopathy.  Skin:    General: Skin is warm.     Coloration: Skin is not jaundiced.     Findings: No lesion or rash.  Neurological:     General: No focal  deficit present.     Mental Status: She is alert and oriented to person, place, and time. Mental status is at baseline.  Psychiatric:        Mood and Affect: Mood normal.        Behavior: Behavior normal.        Thought Content: Thought content normal.    LABS:      Latest Ref Rng & Units 08/22/2023    2:28 PM 07/12/2022    3:05 PM  CBC  WBC 4.0 - 10.5 K/uL 12.7  15.3   Hemoglobin 12.0 - 15.0 g/dL 19.1  47.8   Hematocrit 36.0 - 46.0 % 44.2  42.6   Platelets 150 - 400 K/uL 195      Latest Reference Range & Units 08/22/23 14:28  Neutrophils % 70  Lymphocytes % 16  Monocytes Relative % 9  Eosinophil % 3  Basophil % 1  Immature Granulocytes % 1      Latest Ref Rng & Units 07/01/2023   11:33 AM 07/12/2022    3:05 PM  CMP  Glucose 70 - 99 mg/dL 80  89   BUN 8 - 27 mg/dL 13  13   Creatinine 2.95 - 1.00 mg/dL 6.21  3.08   Sodium 657 - 144 mmol/L 142  141   Potassium 3.5 - 5.2 mmol/L 4.5  4.4   Chloride 96 - 106 mmol/L 104  101   CO2 20 - 29 mmol/L 27  22   Calcium 8.7 - 10.3 mg/dL 9.2  9.8   Total Protein 6.0 - 8.5 g/dL  7.3   Total Bilirubin 0.0 - 1.2 mg/dL  0.6   Alkaline Phos 44 - 121 IU/L  117   AST 0 - 40 IU/L  15   ALT 0 - 32 IU/L  14    ASSESSMENT & PLAN:  A 72 y.o. female who I was asked to consult upon for leukocytosis.  Her white count of 11.7 is only minimally  elevated today.  Furthermore, her white count differential is normal.  Overall, I believe her mild leukocytosis is due to the courses of steroids she has been on over these past few months for her baseline lung issues.  There is nothing per her history or physical exam to suggest something more ominous is behind her leukocytosis.  I do feel comfortable turning her care back over to her primary care office with the recommendation that her CBC be checked 2-3 times per year.  I do not have a problem seeing her in the future if her white count ever goes above 20 or other hematologic changes develop that require  repeat clinical assessment.  The patient understands all the plans discussed today and is in agreement with them.  I do appreciate Gus Height, [place] for his new consult.   Kendel Pesnell Kirby Funk, MD

## 2023-08-22 ENCOUNTER — Encounter: Payer: Self-pay | Admitting: Oncology

## 2023-08-22 ENCOUNTER — Inpatient Hospital Stay: Payer: Medicare Other | Attending: Oncology | Admitting: Oncology

## 2023-08-22 ENCOUNTER — Inpatient Hospital Stay: Payer: Medicare Other

## 2023-08-22 VITALS — BP 148/95 | HR 103 | Temp 98.9°F | Resp 18 | Ht 61.0 in | Wt 212.3 lb

## 2023-08-22 DIAGNOSIS — D72829 Elevated white blood cell count, unspecified: Secondary | ICD-10-CM

## 2023-08-22 LAB — CBC WITH DIFFERENTIAL (CANCER CENTER ONLY)
Abs Immature Granulocytes: 0.06 10*3/uL (ref 0.00–0.07)
Basophils Absolute: 0.1 10*3/uL (ref 0.0–0.1)
Basophils Relative: 1 %
Eosinophils Absolute: 0.4 10*3/uL (ref 0.0–0.5)
Eosinophils Relative: 3 %
HCT: 44.2 % (ref 36.0–46.0)
Hemoglobin: 14.5 g/dL (ref 12.0–15.0)
Immature Granulocytes: 1 %
Lymphocytes Relative: 16 %
Lymphs Abs: 2 10*3/uL (ref 0.7–4.0)
MCH: 29.6 pg (ref 26.0–34.0)
MCHC: 32.8 g/dL (ref 30.0–36.0)
MCV: 90.2 fL (ref 80.0–100.0)
Monocytes Absolute: 1.1 10*3/uL — ABNORMAL HIGH (ref 0.1–1.0)
Monocytes Relative: 9 %
Neutro Abs: 9.1 10*3/uL — ABNORMAL HIGH (ref 1.7–7.7)
Neutrophils Relative %: 70 %
Platelet Count: 195 10*3/uL (ref 150–400)
RBC: 4.9 MIL/uL (ref 3.87–5.11)
RDW: 13.4 % (ref 11.5–15.5)
WBC Count: 12.7 10*3/uL — ABNORMAL HIGH (ref 4.0–10.5)
nRBC: 0 % (ref 0.0–0.2)
nRBC: 0 /100{WBCs}

## 2023-08-25 ENCOUNTER — Encounter: Payer: Self-pay | Admitting: Cardiology

## 2023-08-26 ENCOUNTER — Other Ambulatory Visit: Payer: Self-pay

## 2023-08-26 MED ORDER — ASPIRIN 81 MG PO TBEC: 81.0000 mg | DELAYED_RELEASE_TABLET | Freq: Every day | ORAL | 3 refills | Status: AC

## 2023-08-29 DIAGNOSIS — R42 Dizziness and giddiness: Secondary | ICD-10-CM | POA: Diagnosis not present

## 2023-08-29 DIAGNOSIS — J348212 External nasal valve collapse, dynamic: Secondary | ICD-10-CM | POA: Diagnosis not present

## 2023-08-29 DIAGNOSIS — M26641 Arthritis of right temporomandibular joint: Secondary | ICD-10-CM | POA: Diagnosis not present

## 2023-08-29 DIAGNOSIS — H9201 Otalgia, right ear: Secondary | ICD-10-CM | POA: Diagnosis not present

## 2023-09-16 DIAGNOSIS — D72829 Elevated white blood cell count, unspecified: Secondary | ICD-10-CM

## 2023-09-16 HISTORY — DX: Elevated white blood cell count, unspecified: D72.829

## 2023-09-24 DIAGNOSIS — R300 Dysuria: Secondary | ICD-10-CM | POA: Diagnosis not present

## 2023-09-24 DIAGNOSIS — G500 Trigeminal neuralgia: Secondary | ICD-10-CM | POA: Diagnosis not present

## 2023-09-24 DIAGNOSIS — E538 Deficiency of other specified B group vitamins: Secondary | ICD-10-CM | POA: Diagnosis not present

## 2023-09-24 DIAGNOSIS — Z6838 Body mass index (BMI) 38.0-38.9, adult: Secondary | ICD-10-CM | POA: Diagnosis not present

## 2023-09-24 DIAGNOSIS — I10 Essential (primary) hypertension: Secondary | ICD-10-CM | POA: Diagnosis not present

## 2023-09-24 DIAGNOSIS — F3341 Major depressive disorder, recurrent, in partial remission: Secondary | ICD-10-CM | POA: Diagnosis not present

## 2023-09-24 DIAGNOSIS — E782 Mixed hyperlipidemia: Secondary | ICD-10-CM | POA: Diagnosis not present

## 2023-09-24 DIAGNOSIS — E559 Vitamin D deficiency, unspecified: Secondary | ICD-10-CM | POA: Diagnosis not present

## 2023-09-24 DIAGNOSIS — N3001 Acute cystitis with hematuria: Secondary | ICD-10-CM | POA: Diagnosis not present

## 2023-09-24 DIAGNOSIS — J449 Chronic obstructive pulmonary disease, unspecified: Secondary | ICD-10-CM | POA: Diagnosis not present

## 2023-09-24 DIAGNOSIS — Z23 Encounter for immunization: Secondary | ICD-10-CM | POA: Diagnosis not present

## 2023-10-04 NOTE — Progress Notes (Unsigned)
Cardiology Office Note:    Date:  10/07/2023   ID:  Holly Hardy, DOB 06-17-1951, MRN [id number]  PCP:  Holly Height, PA  Cardiologist:  Holly Herrlich, MD    Referring MD: Holly Hardy, [place]    ASSESSMENT:    1. Costochondral chest pain   2. Coronary artery calcification seen on CAT scan   3. Mild CAD   4. Dyslipidemia   5. SOB (shortness of breath) on exertion   6. Centrilobular emphysema (HCC)    PLAN:    In order of problems listed above:  Fortunately she has very mild nonobstructive CAD on appropriate therapy with aspirin and high intensity statin Chest pain is separate chronic costochondral place her prescription strength Celebrex if symptoms do not improve physical therapy is a good option Continue her statin I will do a lipid profile today And is seen in the office in 1 year Stable COPD     Medication Adjustments/Labs and Tests Ordered: Current medicines are reviewed at length with the patient today.  Concerns regarding medicines are outlined above.  No orders of the defined types were placed in this encounter.  No orders of the defined types were placed in this encounter.    History of Present Illness:    Holly Hardy is a 72 y.o. female with a hx of COPD renal cell cancer with partial nephrectomy coronary artery calcification on lung cancer CT screening chest pain and shortness of breath last seen 07/01/2023.  She had a cardiac CTA reported 07/30/2023 with a mid range coronary calcium score of 131/69th percentile mild CAD was present involving the mid left anterior descending 25 to 49% in proximal and mid left circumflex 25 to 49% stenosis.  Echocardiogram showed no evidence of aortic stenosis EF 60 to 65% normal size wall thickness GLS borderline -17.1 normal right ventricular size and function in particular no findings of congestive heart failure  Compliance with diet, lifestyle and medications: Yes  She continues to be short of breath related  emphysema and COPD She continues to have nonexertional point localized reproducible chest wall pain chronic costochondral He tolerates NSAID I will place her on Celebrex 2 weeks give her 1 refill and told her she can use ibuprofen over-the-counter afterwards once improved She is tolerating her lipid-lowering therapy rosuvastatin Past Medical History:  Diagnosis Date   Calculus of gallbladder with chronic cholecystitis without obstruction 01/29/2019   Cancer of right kidney (HCC) 08/10/2021   Last Assessment & Plan:   Formatting of this note might be different from the original.  [name] is a 72 year old female who underwent a right partial nephrectomy 05/15/2018, ccRCC, pT1bNxR0, FG 3. NED. Stable BMP. RTC in 12 months with CXR, CT abd, and BMP AOT.     Centrilobular emphysema (HCC) 10/12/2019   Formatting of this note might be different from the original.  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  6-minute walk test 10/12/2019 did not show desaturations  ONO 05/13/20 desaturation <88% 3 minute 16 seconds does not diagnose Nocturnal Hypoxia     Elevated white blood cell count 09/16/2023   Facial pain 07/12/2022   History of 2019 novel coronavirus disease (COVID-19) 10/31/2020   Patient reports: 10/08/2020     Nocturnal hypoxia 05/13/2020   Formatting of this note might be different from the original.  Overnight oximetry test 12/20/2020 shows desaturation below 88% 1 hour 3 minutes 20 seconds     Obesity 04/18/2023   This Problem was set by a rule (CHS_EKS_BMI_PROB).     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   PFT 03/29/2021 ratio 71% FEV1 81% FVC 113% DLCO 59%  PFT     10/20/19 ratio 65% FEV1 64% FVC   76% DLCO 73%     Trigeminal neuralgia     Current Medications: No outpatient medications have been marked as taking for the 10/07/23 encounter (Office Visit) with Holly Daub, MD.      EKGs/Labs/Other Studies Reviewed:    The following studies were reviewed today:  Cardiac Studies & Procedures      ECHOCARDIOGRAM  ECHOCARDIOGRAM COMPLETE 08/07/2023  Narrative ECHOCARDIOGRAM REPORT    Patient Name:   Holly Hardy Date of Exam: 08/07/2023 Medical Rec #:  [id number]      Hardy:       61.0 in Accession #:    [id number]     Weight:       210.0 lb Date of Birth:  August 24, 1951      BSA:          1.928 m Patient Age:    71 years       BP:           131/72 mmHg Patient Gender: F              HR:           68 bpm. Exam Location:  [facility]  Procedure: 2D Echo, Cardiac Doppler, Color Doppler and Strain Analysis  Indications:    Chest pain of uncertain etiology [R07.9 (ICD-10-CM)]; SOB (shortness of breath) on exertion [R06.02 (ICD-10-CM)]; Centrilobular emphysema (HCC) [J43.2 (ICD-10-CM)]; Coronary artery calcification seen on CT scan [I25.10 (ICD-10-CM)]; Precordial pain [R07.2 (ICD-10-CM)]  History:        Patient has no prior history of Echocardiogram examinations. CAD, COPD, Signs/Symptoms:Chest Pain and Shortness of Breath; Risk Factors:Former Smoker.  Sonographer:    Margreta Journey RDCS Referring Phys: [id number] Holly Hardy   Sonographer Comments: Suboptimal parasternal window. IMPRESSIONS   1. Left ventricular ejection fraction, by estimation, is 60 to 65%. The left ventricle has normal function. The left ventricle has no regional wall motion abnormalities. Left ventricular diastolic parameters are consistent with Grade I diastolic dysfunction (impaired relaxation). GLS-17.1% 2. Right ventricular systolic function is normal. The right ventricular size is normal. 3. The  mitral valve is normal in structure. No evidence of mitral valve regurgitation. No evidence of mitral stenosis. 4. The aortic valve is normal in structure. Aortic valve regurgitation is not visualized. No aortic stenosis is present. 5. The inferior vena cava is normal in size with greater than 50% respiratory variability, suggesting right atrial pressure of 3 mmHg.  FINDINGS Left Ventricle: Left ventricular ejection fraction, by estimation, is 60 to 65%. The left ventricle has normal function. The left ventricle has no regional wall motion abnormalities. The left ventricular internal cavity size was normal in size. There is no left ventricular hypertrophy. Left ventricular diastolic parameters are consistent with Grade I diastolic dysfunction (impaired relaxation).  Right Ventricle: The right ventricular size is normal. No increase in right ventricular wall thickness. Right ventricular systolic function is  normal.  Left Atrium: Left atrial size was normal in size.  Right Atrium: Right atrial size was normal in size.  Pericardium: There is no evidence of pericardial effusion.  Mitral Valve: The mitral valve is normal in structure. No evidence of mitral valve regurgitation. No evidence of mitral valve stenosis.  Tricuspid Valve: The tricuspid valve is normal in structure. Tricuspid valve regurgitation is not demonstrated. No evidence of tricuspid stenosis.  Aortic Valve: The aortic valve is normal in structure. Aortic valve regurgitation is not visualized. No aortic stenosis is present. Aortic valve mean gradient measures 9.0 mmHg. Aortic valve peak gradient measures 15.8 mmHg.  Pulmonic Valve: The pulmonic valve was normal in structure. Pulmonic valve regurgitation is not visualized. No evidence of pulmonic stenosis.  Aorta: The aortic root is normal in size and structure.  Venous: The inferior vena cava is normal in size with greater than 50% respiratory variability, suggesting right atrial  pressure of 3 mmHg.  IAS/Shunts: No atrial level shunt detected by color flow Doppler.   LEFT VENTRICLE PLAX 2D LVIDd:         3.30 cm Diastology LVIDs:         2.50 cm LV e' medial:    8.81 cm/s LV PW:         1.20 cm LV E/e' medial:  10.6 LV IVS:        1.40 cm LV e' lateral:   9.01 cm/s LV E/e' lateral: 10.4   RIGHT VENTRICLE             IVC RV Basal diam:  3.10 cm     IVC diam: 1.70 cm RV Mid diam:    2.90 cm RV S prime:     13.10 cm/s TAPSE (M-mode): 2.5 cm  LEFT ATRIUM             Index        RIGHT ATRIUM           Index LA diam:        2.70 cm 1.40 cm/m   RA Area:     11.60 cm LA Vol (A2C):   36.6 ml 18.98 ml/m  RA Volume:   25.30 ml  13.12 ml/m LA Vol (A4C):   40.3 ml 20.90 ml/m LA Biplane Vol: 41.0 ml 21.26 ml/m AORTIC VALVE AV Vmax:           199.00 cm/s AV Vmean:          136.000 cm/s AV VTI:            0.430 m AV Peak Grad:      15.8 mmHg AV Mean Grad:      9.0 mmHg LVOT Vmax:         97.05 cm/s LVOT Vmean:        67.200 cm/s LVOT VTI:          0.234 m LVOT/AV VTI ratio: 0.54  AORTA Ao Root diam: 2.90 cm Ao Asc diam:  2.90 cm Ao Desc diam: 2.40 cm  MITRAL VALVE MV Area (PHT): 3.12 cm     SHUNTS MV Decel Time: 243 msec     Systemic VTI: 0.23 m MR Peak grad: 47.9 mmHg MR Vmax:      346.00 cm/s MV E velocity: 93.40 cm/s MV A velocity: 118.50 cm/s MV E/A ratio:  0.79  Belva Crome MD Electronically signed by Belva Crome MD Signature Date/Time: 08/07/2023/3:50:44 PM    Final   MONITORS  LONG TERM MONITOR (3-14  DAYS) 06/17/2019  Narrative A ZIO monitor was performed for 3 days and 2 hours beginning 06/06/2019 to evaluate bradycardia.  The rhythm throughout was sinus with minimum, average and maximum heart rates of 50, 75 and 143 bpm.  There were no pauses of 3 seconds or greater and no episodes of AV nodal or sinus node block.  Ventricular ectopy was rare with isolated PVCs and couplets.  Upper ventricular arrhythmia was rare with  1 7 beat run of atrial premature contractions, paroxysmal atrial tachycardia, at a rate of 185 bpm.  This episode was asymptomatic.  There were no episodes of atrial fibrillation or flutter.  There were 16 triggered events all associated with sinus rhythm   Conclusion, unremarkable event monitor, the brief runs of atrial premature contractions was unassociated with symptoms and the triggered events were unassociated with arrhythmia.  CT SCANS  CT CORONARY MORPH W/CTA COR W/SCORE 07/30/2023  Addendum 08/23/2023  4:43 PM ADDENDUM REPORT: 08/23/2023 16:41  EXAM: OVER-READ INTERPRETATION  CT CHEST  The following report is an over-read performed by radiologist Dr. Curly Shores [facility] on 08/23/2023. This over-read does not include interpretation of cardiac or coronary anatomy or pathology. The coronary CTA interpretation by the cardiologist is attached.  COMPARISON:  None.  FINDINGS: Cardiovascular:  See findings discussed in the body of the report.  Mediastinum/Nodes: No suspicious adenopathy identified. Imaged mediastinal structures are unremarkable.  Lungs/Pleura: Imaged lungs are clear. No pleural effusion or pneumothorax.  Upper Abdomen: No acute abnormality.  Musculoskeletal: No chest wall abnormality. No acute osseous findings.  IMPRESSION: No acute extracardiac incidental findings identified.   Electronically Signed By: Layla Maw M.D. On: 08/23/2023 16:41  Narrative CLINICAL DATA:  Chest pain  EXAM: Cardiac/Coronary CTA  TECHNIQUE: A non-contrast, gated CT scan was obtained with axial slices of 3 mm through the heart for calcium scoring. Calcium scoring was performed using the Agatston method. A 120 kV prospective, gated, contrast cardiac scan was obtained. Gantry rotation speed was 250 msecs and collimation was 0.6 mm. Two sublingual nitroglycerin tablets (0.8 mg) were given. The 3D data set was reconstructed in 5% intervals  of the 35-75% of the R-R cycle. Diastolic phases were analyzed on a dedicated workstation using MPR, MIP, and VRT modes. The patient received 95 cc of contrast.  FINDINGS: Image quality: Excellent.  Noise artifact is: Limited.  Coronary Arteries:  Normal coronary origin.  Right dominance.  Left main: The left main is a large caliber vessel with a normal take off from the left coronary cusp that bifurcates to form a left anterior descending artery and a left circumflex artery. There is no plaque or stenosis.  Left anterior descending artery: The LAD gives off 2 patent diagonal branches. There is mild calcified plaque in the mid LAD with associated stenosis of 25-49%.  Left circumflex artery: The LCX is non-dominant and gives off 2 patent obtuse marginal branches. There is mild calcified and mixed plaque in the proximal and mid LCx with associated stenosis of 25-49%.  Right coronary artery: The RCA is dominant with normal take off from the right coronary cusp. There is minimal calcified plaque in the mid RCA with associated stenosis of <25%. The RCA terminates as a PDA and right posterolateral branch without evidence of plaque or stenosis.  Right Atrium: Right atrial size is within normal limits.  Right Ventricle: The right ventricular cavity is within normal limits.  Left Atrium: Left atrial size is normal in size with no left atrial appendage  filling defect.  Left Ventricle: The ventricular cavity size is within normal limits.  Pulmonary arteries: Normal in size.  Pulmonary veins: Normal pulmonary venous drainage.  Pericardium: Normal thickness without significant effusion or calcium present.  Cardiac valves: The aortic valve is trileaflet with calcifications. AV calcium score 330. The mitral valve is normal without significant calcification.  Aorta: Normal caliber with scattered calcifications.  Extra-cardiac findings: See attached radiology report  for non-cardiac structures.  IMPRESSION: 1. Coronary calcium score of 131. This was 69th percentile for age-, sex, and race-matched controls.  2. Total plaque volume 165 mm3 which is 40th percentile for age- and sex-matched controls (calcified plaque 24mm3; non-calcified plaque 150mm3). TPV is moderate.  3. Normal coronary origin with right dominance.  4. Mild atherosclerosis: 25-49% mid LAD; 25-49% prox and mid LCx.  5. Recommend preventive thearpy and risk factor modification.,  6. Consider non atherosclerotic causes of chest pain  RECOMMENDATIONS: 1. CAD-RADS 0: No evidence of CAD (0%). Consider non-atherosclerotic causes of chest pain.  2. CAD-RADS 1: Minimal non-obstructive CAD (0-24%). Consider non-atherosclerotic causes of chest pain. Consider preventive therapy and risk factor modification.  3. CAD-RADS 2: Mild non-obstructive CAD (25-49%). Consider non-atherosclerotic causes of chest pain. Consider preventive therapy and risk factor modification.  4. CAD-RADS 3: Moderate stenosis. Consider symptom-guided anti-ischemic pharmacotherapy as well as risk factor modification per guideline directed care. Additional analysis with CT FFR will be submitted.  5. CAD-RADS 4: Severe stenosis. (70-99% or > 50% left main). Cardiac catheterization or CT FFR is recommended. Consider symptom-guided anti-ischemic pharmacotherapy as well as risk factor modification per guideline directed care. Invasive coronary angiography recommended with revascularization per published guideline statements.  6. CAD-RADS 5: Total coronary occlusion (100%). Consider cardiac catheterization or viability assessment. Consider symptom-guided anti-ischemic pharmacotherapy as well as risk factor modification per guideline directed care.  7. CAD-RADS N: Non-diagnostic study. Obstructive CAD can't be excluded. Alternative evaluation is recommended.  Armanda Magic, MD  Electronically Signed: By:  Armanda Magic M.D. On: 07/30/2023 19:36              Recent Labs: 07/01/2023: BUN 13; Creatinine, Ser 0.93; Potassium 4.5; Sodium 142 08/22/2023: Hemoglobin 14.5; Platelet Count 195  Recent Lipid Panel No results found for: "CHOL", "TRIG", "HDL", "CHOLHDL", "VLDL", "LDLCALC", "LDLDIRECT"  Physical Exam:    VS:  BP 124/70   Pulse 80   Ht 5\' 1"  (1.549 m)   Wt 213 lb 3.2 oz (96.7 kg)   SpO2 93%   BMI 40.28 kg/m     Wt Readings from Last 3 Encounters:  10/07/23 213 lb 3.2 oz (96.7 kg)  08/22/23 212 lb 4.8 oz (96.3 kg)  07/01/23 210 lb (95.3 kg)     GEN:  Well nourished, well developed in no acute distress HEENT: Normal NECK: No JVD; No carotid bruits LYMPHATICS: No lymphadenopathy CARDIAC: RRR, no murmurs, rubs, gallops RESPIRATORY:  Clear to auscultation without rales, wheezing or rhonchi  ABDOMEN: Soft, non-tender, non-distended MUSCULOSKELETAL:  No edema; No deformity  SKIN: Warm and dry NEUROLOGIC:  Alert and oriented x 3 PSYCHIATRIC:  Normal affect    Signed, Holly Herrlich, MD  10/07/2023 2:46 PM    [facility]

## 2023-10-07 ENCOUNTER — Ambulatory Visit: Payer: Medicare Other | Attending: Cardiology | Admitting: Cardiology

## 2023-10-07 VITALS — BP 124/70 | HR 80 | Ht 61.0 in | Wt 213.2 lb

## 2023-10-07 DIAGNOSIS — R0602 Shortness of breath: Secondary | ICD-10-CM | POA: Insufficient documentation

## 2023-10-07 DIAGNOSIS — R0789 Other chest pain: Secondary | ICD-10-CM | POA: Insufficient documentation

## 2023-10-07 DIAGNOSIS — I2510 Atherosclerotic heart disease of native coronary artery without angina pectoris: Secondary | ICD-10-CM | POA: Diagnosis not present

## 2023-10-07 DIAGNOSIS — J432 Centrilobular emphysema: Secondary | ICD-10-CM | POA: Insufficient documentation

## 2023-10-07 DIAGNOSIS — E785 Hyperlipidemia, unspecified: Secondary | ICD-10-CM | POA: Insufficient documentation

## 2023-10-07 MED ORDER — CELECOXIB 100 MG PO CAPS: 100.0000 mg | ORAL_CAPSULE | Freq: Two times a day (BID) | ORAL | 1 refills | Status: AC

## 2023-10-07 NOTE — Patient Instructions (Signed)
Medication Instructions:  Your physician has recommended you make the following change in your medication:   Start Celebrex 100 mg two times daily for 2 weeks. Do NOT take Ibuprofen while taking Celebrex.   *If you need a refill on your cardiac medications before your next appointment, please call your pharmacy*   Lab Work: Your physician recommends that you have a lipids today in the office.  If you have labs (blood work) drawn today and your tests are completely normal, you will receive your results only by: MyChart Message (if you have MyChart) OR A paper copy in the mail If you have any lab test that is abnormal or we need to change your treatment, we will call you to review the results.   Testing/Procedures: None ordered   Follow-Up: At [facility], you and your health needs are our priority.  As part of our continuing mission to provide you with exceptional heart care, we have created designated Provider Care Teams.  These Care Teams include your primary Cardiologist (physician) and Advanced Practice Providers (APPs -  Physician Assistants and Nurse Practitioners) who all work together to provide you with the care you need, when you need it.  We recommend signing up for the patient portal called "MyChart".  Sign up information is provided on this After Visit Summary.  MyChart is used to connect with patients for Virtual Visits (Telemedicine).  Patients are able to view lab/test results, encounter notes, upcoming appointments, etc.  Non-urgent messages can be sent to your provider as well.   To learn more about what you can do with MyChart, go to [email or web address].    Your next appointment:   12 month(s)  The format for your next appointment:   In Person  Provider:   Belva Crome, MD    Other Instructions none  Important Information About Sugar

## 2023-10-08 LAB — LIPID PANEL
Chol/HDL Ratio: 3.4 {ratio} (ref 0.0–4.4)
Cholesterol, Total: 171 mg/dL (ref 100–199)
HDL: 51 mg/dL (ref >39)
LDL Chol Calc (NIH): 86 mg/dL (ref 0–99)
Triglycerides: 205 mg/dL — ABNORMAL HIGH (ref 0–149)
VLDL Cholesterol Cal: 34 mg/dL (ref 5–40)

## 2023-10-16 ENCOUNTER — Ambulatory Visit
Admission: EM | Admit: 2023-10-16 | Discharge: 2023-10-16 | Disposition: A | Discharge status: Home / Self Care | Payer: Medicare Other | Attending: Family Medicine | Admitting: Family Medicine

## 2023-10-16 DIAGNOSIS — N3001 Acute cystitis with hematuria: Secondary | ICD-10-CM | POA: Diagnosis not present

## 2023-10-16 DIAGNOSIS — M25511 Pain in right shoulder: Secondary | ICD-10-CM | POA: Diagnosis not present

## 2023-10-16 LAB — POCT URINALYSIS DIP (MANUAL ENTRY)
Glucose, UA: NEGATIVE mg/dL
Nitrite, UA: POSITIVE — AB
Protein Ur, POC: 30 mg/dL — AB
Spec Grav, UA: >=1.03 — AB (ref 1.010–1.025)
Urobilinogen, UA: 1 U/dL
pH, UA: 5.5 (ref 5.0–8.0)

## 2023-10-16 MED ORDER — CEPHALEXIN 500 MG PO CAPS: 500.0000 mg | ORAL_CAPSULE | Freq: Three times a day (TID) | ORAL | 0 refills | Status: AC

## 2023-10-16 NOTE — Discharge Instructions (Signed)

## 2023-10-16 NOTE — ED Provider Notes (Signed)
 [facility]  Note:  This document was prepared using Conservation officer, historic buildings and may include unintentional dictation errors.  MRN: [id number] DOB: 07/22/1951  Subjective:   Holly Hardy is a 73 y.o. female presenting for 3-day history of dark and cloudy urine, pelvic pain.  Patient had and urinary tract infection last month, was started on ciprofloxacin.  She ended up stopping the medication with about 3 or 4 days left.  When her symptoms started back up in the past days she took ciprofloxacin again but made her feel sick.  Denies fever, bloody stools, vaginal discharge.  She does have a history of diverticulitis.  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  No current facility-administered medications for this encounter.  Current Outpatient Medications:    aspirin  EC 81 MG tablet, Take 1 tablet (81 mg total) by mouth daily. Swallow whole., Disp: 90 tablet, Rfl: 3   celecoxib  (CELEBREX ) 100 MG capsule, Take 1 capsule (100 mg total) by mouth 2 (two) times daily., Disp: 28 capsule, Rfl: 1   cyanocobalamin  (VITAMIN B12) 1000 MCG/ML injection, Inject 1,000 mcg into the muscle once a week., Disp: , Rfl:    ergocalciferol (VITAMIN D2) 1.25 MG (50000 UT) capsule, Take 50,000 Units by mouth once a week., Disp: , Rfl:    ipratropium-albuterol  (DUONEB) 0.5-2.5 (3) MG/3ML SOLN, Take 3 mLs by nebulization as needed (wheezing or shortness of breath)., Disp: , Rfl:    metoprolol  tartrate (LOPRESSOR ) 100 MG tablet, Take 1 tablet (100 mg total) by mouth once for 1 dose. Please take this medication 2 hours before CT., Disp: 1 tablet, Rfl: 0   rosuvastatin  (CRESTOR ) 10 MG tablet, Take 1 tablet (10 mg total) by mouth daily., Disp: 90 tablet, Rfl: 3   sertraline (ZOLOFT) 25 MG tablet, Take 25 mg by mouth daily., Disp: , Rfl:    tiotropium (SPIRIVA) 18 MCG inhalation capsule, Place 18 mcg into inhaler and inhale daily., Disp: , Rfl:    valsartan (DIOVAN)  160 MG tablet, Take 1 tablet by mouth daily., Disp: , Rfl:    VENTOLIN  HFA 108 (90 Base) MCG/ACT inhaler, Inhale 2 puffs into the lungs every 6 (six) hours as needed for wheezing or shortness of breath., Disp: , Rfl:    Allergies  Allergen Reactions   Codeine Swelling   Meperidine Itching and Swelling    Past Medical History:  Diagnosis Date   Calculus of gallbladder with chronic cholecystitis without obstruction 01/29/2019   Cancer of right kidney (HCC) 08/10/2021   Last Assessment & Plan:   Formatting of this note might be different from the original.  Holly Hardy is a 73 year old female who underwent a right partial nephrectomy 05/15/2018, ccRCC, pT1bNxR0, FG 3. NED. Stable BMP. RTC in 12 months with CXR, CT abd, and BMP AOT.     Centrilobular emphysema (HCC) 10/12/2019   Formatting of this note might be different from the original.  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  6-minute walk test 10/12/2019 did not show desaturations  ONO 05/13/20 desaturation <88% 3 minute 16 seconds does not diagnose Nocturnal Hypoxia     Elevated white blood cell count 09/16/2023   Facial pain 07/12/2022   History of 2019 novel coronavirus disease (COVID-19) 10/31/2020   Patient reports: 10/08/2020  Nocturnal hypoxia 05/13/2020   Formatting of this note might be different from the original.  Overnight oximetry test 12/20/2020 shows desaturation below 88% 1 hour 3 minutes 20 seconds     Obesity 04/18/2023   This Problem was set by a rule (CHS_EKS_BMI_PROB).     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  PFT 03/29/2021 ratio  71% FEV1 81% FVC 113% DLCO 59%  PFT     10/20/19 ratio 65% FEV1 64% FVC   76% DLCO 73%     Trigeminal neuralgia      Past Surgical History:  Procedure Laterality Date   BUNIONECTOMY Bilateral    CHOLECYSTECTOMY     COLONOSCOPY W/ POLYPECTOMY     NASAL SEPTUM SURGERY     NASAL SINUS SURGERY     NEPHRECTOMY RADICAL     TONSILLECTOMY AND ADENOIDECTOMY      Family History  Problem Relation Age of Onset   Kidney failure Mother    Liver cancer Father    Multiple myeloma Sister    Other Brother        SUDDEN INFANT DEATH   Other Brother        SUICIDE   Diabetes Brother        IN SNF   Breast cancer Maternal Aunt     Social History   Tobacco Use   Smoking status: Former    Current packs/day: 0.00    Types: Cigarettes    Quit date: 2020    Years since quitting: 5.0   Smokeless tobacco: Never  Vaping Use   Vaping status: Never Used  Substance Use Topics   Alcohol  use: Not Currently   Drug use: Not Currently    Comment: hx of cocaine use    ROS   Objective:   Vitals: BP 135/85 (BP Location: Right Arm)   Pulse (!) 101   Temp 98.9 F (37.2 C) (Oral)   Resp 16   SpO2 93%   Physical Exam Constitutional:      General: She is not in acute distress.    Appearance: Normal appearance. She is well-developed. She is not ill-appearing, toxic-appearing or diaphoretic.  HENT:     Head: Normocephalic and atraumatic.     Nose: Nose normal.     Mouth/Throat:     Mouth: Mucous membranes are moist.  Eyes:     General: No scleral icterus.       Right eye: No discharge.        Left eye: No discharge.     Extraocular Movements: Extraocular movements intact.     Conjunctiva/sclera: Conjunctivae normal.  Cardiovascular:     Rate and Rhythm: Normal rate.  Pulmonary:     Effort: Pulmonary effort is normal.  Abdominal:     General: Bowel sounds are normal. There is no distension.     Palpations: Abdomen is soft. There is no mass.     Tenderness: There is abdominal  tenderness in the suprapubic area. There is no right CVA tenderness, left CVA tenderness, guarding or rebound.  Skin:    General: Skin is warm and dry.  Neurological:     General: No focal deficit present.     Mental Status: She is alert and oriented to person, place, and time.  Psychiatric:  Mood and Affect: Mood normal.        Behavior: Behavior normal.        Thought Content: Thought content normal.        Judgment: Judgment normal.     Results for orders placed or performed during the hospital encounter of 10/16/23 (from the past 24 hours)  POCT urinalysis dipstick     Status: Abnormal   Collection Time: 10/16/23  3:22 PM  Result Value Ref Range   Color, UA orange (A) yellow   Clarity, UA turbid (A) clear   Glucose, UA negative negative mg/dL   Bilirubin, UA small (A) negative   Ketones, POC UA trace (5) (A) negative mg/dL   Spec Grav, UA >=8.969 (A) 1.010 - 1.025   Blood, UA trace-lysed (A) negative   pH, UA 5.5 5.0 - 8.0   Protein Ur, POC =30 (A) negative mg/dL   Urobilinogen, UA 1.0 0.2 or 1.0 E.U./dL   Nitrite, UA Positive (A) Negative   Leukocytes, UA Trace (A) Negative    Assessment and Plan :   PDMP not reviewed this encounter.  1. Acute cystitis with hematuria    Start cephalexin  3 times daily to cover for acute cystitis, urine culture pending.  Recommended aggressive hydration, limiting urinary irritants. Counseled patient on potential for adverse effects with medications prescribed/recommended today, ER and return-to-clinic precautions discussed, patient verbalized understanding.    Christopher Savannah, [place] 10/16/23 1535

## 2023-10-16 NOTE — ED Triage Notes (Signed)
 Pt states pelvic pain for the past 3 days.  States she had a UTI and didn't finish her antibiotics.

## 2023-10-18 LAB — URINE CULTURE: Culture: >=100000 — AB

## 2023-10-28 DIAGNOSIS — H25813 Combined forms of age-related cataract, bilateral: Secondary | ICD-10-CM | POA: Diagnosis not present

## 2023-10-28 DIAGNOSIS — H3530 Unspecified macular degeneration: Secondary | ICD-10-CM | POA: Diagnosis not present

## 2023-10-28 DIAGNOSIS — H5203 Hypermetropia, bilateral: Secondary | ICD-10-CM | POA: Diagnosis not present

## 2023-10-28 DIAGNOSIS — H52223 Regular astigmatism, bilateral: Secondary | ICD-10-CM | POA: Diagnosis not present

## 2023-10-28 DIAGNOSIS — H524 Presbyopia: Secondary | ICD-10-CM | POA: Diagnosis not present

## 2023-10-28 DIAGNOSIS — H353131 Nonexudative age-related macular degeneration, bilateral, early dry stage: Secondary | ICD-10-CM | POA: Diagnosis not present

## 2023-11-26 DIAGNOSIS — M25511 Pain in right shoulder: Secondary | ICD-10-CM | POA: Diagnosis not present

## 2023-12-05 DIAGNOSIS — R300 Dysuria: Secondary | ICD-10-CM | POA: Diagnosis not present

## 2023-12-05 DIAGNOSIS — N390 Urinary tract infection, site not specified: Secondary | ICD-10-CM | POA: Diagnosis not present

## 2023-12-05 DIAGNOSIS — N3001 Acute cystitis with hematuria: Secondary | ICD-10-CM | POA: Diagnosis not present

## 2023-12-05 DIAGNOSIS — Z6837 Body mass index (BMI) 37.0-37.9, adult: Secondary | ICD-10-CM | POA: Diagnosis not present

## 2023-12-09 DIAGNOSIS — M25511 Pain in right shoulder: Secondary | ICD-10-CM | POA: Diagnosis not present

## 2023-12-20 DIAGNOSIS — M19011 Primary osteoarthritis, right shoulder: Secondary | ICD-10-CM | POA: Diagnosis not present

## 2023-12-20 DIAGNOSIS — M75111 Incomplete rotator cuff tear or rupture of right shoulder, not specified as traumatic: Secondary | ICD-10-CM | POA: Diagnosis not present

## 2023-12-27 DIAGNOSIS — Z905 Acquired absence of kidney: Secondary | ICD-10-CM | POA: Diagnosis not present

## 2023-12-27 DIAGNOSIS — Z87440 Personal history of urinary (tract) infections: Secondary | ICD-10-CM | POA: Diagnosis not present

## 2023-12-27 DIAGNOSIS — Z9981 Dependence on supplemental oxygen: Secondary | ICD-10-CM | POA: Diagnosis not present

## 2023-12-27 DIAGNOSIS — Z7982 Long term (current) use of aspirin: Secondary | ICD-10-CM | POA: Diagnosis not present

## 2023-12-27 DIAGNOSIS — J45909 Unspecified asthma, uncomplicated: Secondary | ICD-10-CM | POA: Diagnosis not present

## 2023-12-27 DIAGNOSIS — Z79899 Other long term (current) drug therapy: Secondary | ICD-10-CM | POA: Diagnosis not present

## 2023-12-27 DIAGNOSIS — R4701 Aphasia: Secondary | ICD-10-CM | POA: Diagnosis not present

## 2023-12-27 DIAGNOSIS — N390 Urinary tract infection, site not specified: Secondary | ICD-10-CM | POA: Diagnosis not present

## 2023-12-27 DIAGNOSIS — G459 Transient cerebral ischemic attack, unspecified: Secondary | ICD-10-CM | POA: Diagnosis not present

## 2023-12-27 DIAGNOSIS — K047 Periapical abscess without sinus: Secondary | ICD-10-CM | POA: Diagnosis not present

## 2023-12-27 DIAGNOSIS — D631 Anemia in chronic kidney disease: Secondary | ICD-10-CM | POA: Diagnosis not present

## 2023-12-27 DIAGNOSIS — E785 Hyperlipidemia, unspecified: Secondary | ICD-10-CM | POA: Diagnosis not present

## 2023-12-27 DIAGNOSIS — N183 Chronic kidney disease, stage 3 unspecified: Secondary | ICD-10-CM | POA: Diagnosis not present

## 2023-12-27 DIAGNOSIS — C641 Malignant neoplasm of right kidney, except renal pelvis: Secondary | ICD-10-CM | POA: Diagnosis not present

## 2023-12-27 DIAGNOSIS — D72829 Elevated white blood cell count, unspecified: Secondary | ICD-10-CM | POA: Diagnosis not present

## 2023-12-27 DIAGNOSIS — N3000 Acute cystitis without hematuria: Secondary | ICD-10-CM | POA: Diagnosis not present

## 2023-12-27 DIAGNOSIS — J449 Chronic obstructive pulmonary disease, unspecified: Secondary | ICD-10-CM | POA: Diagnosis not present

## 2023-12-27 DIAGNOSIS — Z87891 Personal history of nicotine dependence: Secondary | ICD-10-CM | POA: Diagnosis not present

## 2023-12-27 DIAGNOSIS — I129 Hypertensive chronic kidney disease with stage 1 through stage 4 chronic kidney disease, or unspecified chronic kidney disease: Secondary | ICD-10-CM | POA: Diagnosis not present

## 2023-12-27 DIAGNOSIS — I4940 Unspecified premature depolarization: Secondary | ICD-10-CM | POA: Diagnosis not present

## 2023-12-27 DIAGNOSIS — J9611 Chronic respiratory failure with hypoxia: Secondary | ICD-10-CM | POA: Diagnosis not present

## 2023-12-27 DIAGNOSIS — R4781 Slurred speech: Secondary | ICD-10-CM | POA: Diagnosis not present

## 2023-12-28 DIAGNOSIS — Z87440 Personal history of urinary (tract) infections: Secondary | ICD-10-CM | POA: Diagnosis not present

## 2023-12-28 DIAGNOSIS — N390 Urinary tract infection, site not specified: Secondary | ICD-10-CM | POA: Diagnosis not present

## 2023-12-28 DIAGNOSIS — I517 Cardiomegaly: Secondary | ICD-10-CM | POA: Diagnosis not present

## 2023-12-28 DIAGNOSIS — G459 Transient cerebral ischemic attack, unspecified: Secondary | ICD-10-CM | POA: Diagnosis not present

## 2023-12-31 DIAGNOSIS — N390 Urinary tract infection, site not specified: Secondary | ICD-10-CM | POA: Diagnosis not present

## 2023-12-31 DIAGNOSIS — Z8673 Personal history of transient ischemic attack (TIA), and cerebral infarction without residual deficits: Secondary | ICD-10-CM | POA: Diagnosis not present

## 2023-12-31 DIAGNOSIS — Z6837 Body mass index (BMI) 37.0-37.9, adult: Secondary | ICD-10-CM | POA: Diagnosis not present

## 2023-12-31 DIAGNOSIS — Z79899 Other long term (current) drug therapy: Secondary | ICD-10-CM | POA: Diagnosis not present

## 2024-01-16 DIAGNOSIS — N3020 Other chronic cystitis without hematuria: Secondary | ICD-10-CM | POA: Diagnosis not present

## 2024-01-16 DIAGNOSIS — N952 Postmenopausal atrophic vaginitis: Secondary | ICD-10-CM | POA: Diagnosis not present

## 2024-01-17 DIAGNOSIS — M19011 Primary osteoarthritis, right shoulder: Secondary | ICD-10-CM | POA: Diagnosis not present

## 2024-02-04 DIAGNOSIS — R519 Headache, unspecified: Secondary | ICD-10-CM | POA: Diagnosis not present

## 2024-02-04 DIAGNOSIS — Z8673 Personal history of transient ischemic attack (TIA), and cerebral infarction without residual deficits: Secondary | ICD-10-CM | POA: Diagnosis not present

## 2024-02-13 DIAGNOSIS — R413 Other amnesia: Secondary | ICD-10-CM | POA: Diagnosis not present

## 2024-02-13 DIAGNOSIS — R519 Headache, unspecified: Secondary | ICD-10-CM | POA: Diagnosis not present

## 2024-02-13 DIAGNOSIS — R200 Anesthesia of skin: Secondary | ICD-10-CM | POA: Diagnosis not present

## 2024-02-13 DIAGNOSIS — Z79899 Other long term (current) drug therapy: Secondary | ICD-10-CM | POA: Diagnosis not present

## 2024-02-13 DIAGNOSIS — Z6837 Body mass index (BMI) 37.0-37.9, adult: Secondary | ICD-10-CM | POA: Diagnosis not present

## 2024-02-24 DIAGNOSIS — H47532 Disorders of visual pathways in (due to) vascular disorders, left side: Secondary | ICD-10-CM | POA: Diagnosis not present

## 2024-02-24 DIAGNOSIS — H539 Unspecified visual disturbance: Secondary | ICD-10-CM | POA: Diagnosis not present

## 2024-02-24 DIAGNOSIS — I1A0 Resistant hypertension: Secondary | ICD-10-CM | POA: Diagnosis not present

## 2024-02-24 DIAGNOSIS — H4603 Optic papillitis, bilateral: Secondary | ICD-10-CM | POA: Diagnosis not present

## 2024-02-24 DIAGNOSIS — D520 Dietary folate deficiency anemia: Secondary | ICD-10-CM | POA: Diagnosis not present

## 2024-02-24 DIAGNOSIS — G459 Transient cerebral ischemic attack, unspecified: Secondary | ICD-10-CM | POA: Diagnosis not present

## 2024-02-24 DIAGNOSIS — Z6838 Body mass index (BMI) 38.0-38.9, adult: Secondary | ICD-10-CM | POA: Diagnosis not present

## 2024-02-24 DIAGNOSIS — Z131 Encounter for screening for diabetes mellitus: Secondary | ICD-10-CM | POA: Diagnosis not present

## 2024-02-24 DIAGNOSIS — R519 Headache, unspecified: Secondary | ICD-10-CM | POA: Diagnosis not present

## 2024-02-24 DIAGNOSIS — G4489 Other headache syndrome: Secondary | ICD-10-CM | POA: Diagnosis not present

## 2024-03-04 DIAGNOSIS — M19011 Primary osteoarthritis, right shoulder: Secondary | ICD-10-CM | POA: Diagnosis not present

## 2024-03-10 DIAGNOSIS — I1A0 Resistant hypertension: Secondary | ICD-10-CM | POA: Diagnosis not present

## 2024-03-10 DIAGNOSIS — R519 Headache, unspecified: Secondary | ICD-10-CM | POA: Diagnosis not present

## 2024-03-10 DIAGNOSIS — H539 Unspecified visual disturbance: Secondary | ICD-10-CM | POA: Diagnosis not present

## 2024-03-10 DIAGNOSIS — G459 Transient cerebral ischemic attack, unspecified: Secondary | ICD-10-CM | POA: Diagnosis not present

## 2024-03-10 DIAGNOSIS — I10 Essential (primary) hypertension: Secondary | ICD-10-CM | POA: Diagnosis not present

## 2024-03-11 DIAGNOSIS — R233 Spontaneous ecchymoses: Secondary | ICD-10-CM | POA: Diagnosis not present

## 2024-03-11 DIAGNOSIS — D485 Neoplasm of uncertain behavior of skin: Secondary | ICD-10-CM | POA: Diagnosis not present

## 2024-03-18 DIAGNOSIS — C44622 Squamous cell carcinoma of skin of right upper limb, including shoulder: Secondary | ICD-10-CM | POA: Diagnosis not present

## 2024-03-24 DIAGNOSIS — L0889 Other specified local infections of the skin and subcutaneous tissue: Secondary | ICD-10-CM | POA: Diagnosis not present

## 2024-04-03 DIAGNOSIS — M19011 Primary osteoarthritis, right shoulder: Secondary | ICD-10-CM | POA: Diagnosis not present

## 2024-04-15 DIAGNOSIS — R519 Headache, unspecified: Secondary | ICD-10-CM | POA: Diagnosis not present

## 2024-04-17 DIAGNOSIS — N3020 Other chronic cystitis without hematuria: Secondary | ICD-10-CM | POA: Diagnosis not present

## 2024-04-17 DIAGNOSIS — N952 Postmenopausal atrophic vaginitis: Secondary | ICD-10-CM | POA: Diagnosis not present

## 2024-04-22 DIAGNOSIS — M67911 Unspecified disorder of synovium and tendon, right shoulder: Secondary | ICD-10-CM | POA: Diagnosis not present

## 2024-05-18 DIAGNOSIS — G500 Trigeminal neuralgia: Secondary | ICD-10-CM | POA: Diagnosis not present

## 2024-05-18 DIAGNOSIS — F3341 Major depressive disorder, recurrent, in partial remission: Secondary | ICD-10-CM | POA: Diagnosis not present

## 2024-05-18 DIAGNOSIS — J449 Chronic obstructive pulmonary disease, unspecified: Secondary | ICD-10-CM | POA: Diagnosis not present

## 2024-05-18 DIAGNOSIS — R42 Dizziness and giddiness: Secondary | ICD-10-CM | POA: Diagnosis not present

## 2024-05-18 DIAGNOSIS — N644 Mastodynia: Secondary | ICD-10-CM | POA: Diagnosis not present

## 2024-05-18 DIAGNOSIS — Z6836 Body mass index (BMI) 36.0-36.9, adult: Secondary | ICD-10-CM | POA: Diagnosis not present

## 2024-05-18 DIAGNOSIS — R300 Dysuria: Secondary | ICD-10-CM | POA: Diagnosis not present

## 2024-05-18 DIAGNOSIS — E782 Mixed hyperlipidemia: Secondary | ICD-10-CM | POA: Diagnosis not present

## 2024-05-18 DIAGNOSIS — I10 Essential (primary) hypertension: Secondary | ICD-10-CM | POA: Diagnosis not present

## 2024-05-22 DIAGNOSIS — R42 Dizziness and giddiness: Secondary | ICD-10-CM | POA: Diagnosis not present

## 2024-05-24 DIAGNOSIS — R42 Dizziness and giddiness: Secondary | ICD-10-CM | POA: Diagnosis not present

## 2024-06-05 ENCOUNTER — Ambulatory Visit: Payer: Self-pay

## 2024-06-05 DIAGNOSIS — G459 Transient cerebral ischemic attack, unspecified: Secondary | ICD-10-CM | POA: Diagnosis not present

## 2024-06-05 DIAGNOSIS — R519 Headache, unspecified: Secondary | ICD-10-CM | POA: Diagnosis not present

## 2024-06-05 DIAGNOSIS — I160 Hypertensive urgency: Secondary | ICD-10-CM | POA: Diagnosis not present

## 2024-06-05 NOTE — Telephone Encounter (Signed)
 This encounter was created in error - please disregard.

## 2024-06-05 NOTE — Telephone Encounter (Signed)
 Copied from CRM [id number]. Topic: Clinical - Red Word Triage >> Jun 05, 2024  7:45 AM Deleta RAMAN wrote: Red Word that prompted transfer to Nurse Triage: DR. Heron croak from Neurologist department from [facility]. patient has been having Dred symptoms for the last week.New on set headaches, hypertensive, and possible heart problem

## 2024-06-10 DIAGNOSIS — R519 Headache, unspecified: Secondary | ICD-10-CM | POA: Diagnosis not present

## 2024-06-10 DIAGNOSIS — I10 Essential (primary) hypertension: Secondary | ICD-10-CM | POA: Diagnosis not present

## 2024-06-17 DIAGNOSIS — N644 Mastodynia: Secondary | ICD-10-CM | POA: Diagnosis not present

## 2024-06-17 DIAGNOSIS — R92323 Mammographic fibroglandular density, bilateral breasts: Secondary | ICD-10-CM | POA: Diagnosis not present

## 2024-06-17 DIAGNOSIS — R92321 Mammographic fibroglandular density, right breast: Secondary | ICD-10-CM | POA: Diagnosis not present

## 2024-06-22 DIAGNOSIS — Z6836 Body mass index (BMI) 36.0-36.9, adult: Secondary | ICD-10-CM | POA: Diagnosis not present

## 2024-06-22 DIAGNOSIS — J449 Chronic obstructive pulmonary disease, unspecified: Secondary | ICD-10-CM | POA: Diagnosis not present

## 2024-06-22 DIAGNOSIS — Z79899 Other long term (current) drug therapy: Secondary | ICD-10-CM | POA: Diagnosis not present

## 2024-06-22 DIAGNOSIS — R519 Headache, unspecified: Secondary | ICD-10-CM | POA: Diagnosis not present

## 2024-07-28 DIAGNOSIS — N289 Disorder of kidney and ureter, unspecified: Secondary | ICD-10-CM | POA: Diagnosis not present

## 2024-07-28 DIAGNOSIS — Z122 Encounter for screening for malignant neoplasm of respiratory organs: Secondary | ICD-10-CM | POA: Diagnosis not present

## 2024-07-28 DIAGNOSIS — Z08 Encounter for follow-up examination after completed treatment for malignant neoplasm: Secondary | ICD-10-CM | POA: Diagnosis not present

## 2024-07-28 DIAGNOSIS — F17210 Nicotine dependence, cigarettes, uncomplicated: Secondary | ICD-10-CM | POA: Diagnosis not present

## 2024-07-28 DIAGNOSIS — Z905 Acquired absence of kidney: Secondary | ICD-10-CM | POA: Diagnosis not present

## 2024-07-28 DIAGNOSIS — R911 Solitary pulmonary nodule: Secondary | ICD-10-CM | POA: Diagnosis not present

## 2024-07-28 DIAGNOSIS — C641 Malignant neoplasm of right kidney, except renal pelvis: Secondary | ICD-10-CM | POA: Diagnosis not present

## 2024-07-28 DIAGNOSIS — I2510 Atherosclerotic heart disease of native coronary artery without angina pectoris: Secondary | ICD-10-CM | POA: Diagnosis not present

## 2024-07-28 DIAGNOSIS — Z85528 Personal history of other malignant neoplasm of kidney: Secondary | ICD-10-CM | POA: Diagnosis not present

## 2024-07-28 DIAGNOSIS — N281 Cyst of kidney, acquired: Secondary | ICD-10-CM | POA: Diagnosis not present

## 2024-07-28 DIAGNOSIS — K669 Disorder of peritoneum, unspecified: Secondary | ICD-10-CM | POA: Diagnosis not present

## 2024-07-28 DIAGNOSIS — R590 Localized enlarged lymph nodes: Secondary | ICD-10-CM | POA: Diagnosis not present

## 2024-07-28 DIAGNOSIS — J432 Centrilobular emphysema: Secondary | ICD-10-CM | POA: Diagnosis not present

## 2024-07-28 DIAGNOSIS — J982 Interstitial emphysema: Secondary | ICD-10-CM | POA: Diagnosis not present

## 2024-08-25 DIAGNOSIS — C641 Malignant neoplasm of right kidney, except renal pelvis: Secondary | ICD-10-CM | POA: Diagnosis not present

## 2024-08-25 DIAGNOSIS — N289 Disorder of kidney and ureter, unspecified: Secondary | ICD-10-CM | POA: Diagnosis not present

## 2024-09-10 DIAGNOSIS — E782 Mixed hyperlipidemia: Secondary | ICD-10-CM | POA: Diagnosis not present

## 2024-09-10 DIAGNOSIS — I10 Essential (primary) hypertension: Secondary | ICD-10-CM | POA: Diagnosis not present

## 2024-09-10 DIAGNOSIS — J449 Chronic obstructive pulmonary disease, unspecified: Secondary | ICD-10-CM | POA: Diagnosis not present

## 2024-09-10 DIAGNOSIS — F3341 Major depressive disorder, recurrent, in partial remission: Secondary | ICD-10-CM | POA: Diagnosis not present

## 2024-09-10 DIAGNOSIS — Z6836 Body mass index (BMI) 36.0-36.9, adult: Secondary | ICD-10-CM | POA: Diagnosis not present

## 2024-09-10 DIAGNOSIS — G500 Trigeminal neuralgia: Secondary | ICD-10-CM | POA: Diagnosis not present
